# Patient Record
Sex: Female | Born: 1955 | Race: White | Hispanic: No | Marital: Married | Smoking: Former smoker
Health system: Southern US, Community
[De-identification: ages and names within clinical notes are randomized; demographics above are authoritative.]

## PROBLEM LIST (undated history)

## (undated) DIAGNOSIS — K219 Gastro-esophageal reflux disease without esophagitis: Secondary | ICD-10-CM

## (undated) DIAGNOSIS — K635 Polyp of colon: Secondary | ICD-10-CM

## (undated) DIAGNOSIS — G43909 Migraine, unspecified, not intractable, without status migrainosus: Secondary | ICD-10-CM

## (undated) DIAGNOSIS — K5790 Diverticulosis of intestine, part unspecified, without perforation or abscess without bleeding: Secondary | ICD-10-CM

## (undated) DIAGNOSIS — Z8742 Personal history of other diseases of the female genital tract: Secondary | ICD-10-CM

## (undated) DIAGNOSIS — E039 Hypothyroidism, unspecified: Secondary | ICD-10-CM

## (undated) HISTORY — DX: Diverticulosis of intestine, part unspecified, without perforation or abscess without bleeding: K57.90

## (undated) HISTORY — DX: Personal history of other diseases of the female genital tract: Z87.42

## (undated) HISTORY — DX: Gastro-esophageal reflux disease without esophagitis: K21.9

## (undated) HISTORY — DX: Polyp of colon: K63.5

## (undated) HISTORY — DX: Migraine, unspecified, not intractable, without status migrainosus: G43.909

## (undated) HISTORY — DX: Hypothyroidism, unspecified: E03.9

## (undated) HISTORY — PX: OTHER SURGICAL HISTORY: SHX169

---

## 1998-09-23 HISTORY — PX: OTHER SURGICAL HISTORY: SHX169

## 1998-12-04 ENCOUNTER — Other Ambulatory Visit: Admission: RE | Admit: 1998-12-04 | Discharge: 1998-12-04 | Payer: Self-pay | Admitting: Obstetrics and Gynecology

## 2001-07-06 ENCOUNTER — Encounter: Payer: Self-pay | Admitting: Obstetrics and Gynecology

## 2001-07-06 ENCOUNTER — Other Ambulatory Visit: Admission: RE | Admit: 2001-07-06 | Discharge: 2001-07-06 | Payer: Self-pay | Admitting: Obstetrics and Gynecology

## 2001-07-06 ENCOUNTER — Encounter: Admission: RE | Admit: 2001-07-06 | Discharge: 2001-07-06 | Payer: Self-pay | Admitting: Obstetrics and Gynecology

## 2004-06-13 ENCOUNTER — Encounter: Admission: RE | Admit: 2004-06-13 | Discharge: 2004-06-13 | Payer: Self-pay | Admitting: Obstetrics and Gynecology

## 2008-02-01 ENCOUNTER — Encounter: Admission: RE | Admit: 2008-02-01 | Discharge: 2008-02-01 | Payer: Self-pay | Admitting: Obstetrics and Gynecology

## 2010-02-08 ENCOUNTER — Encounter: Admission: RE | Admit: 2010-02-08 | Discharge: 2010-02-08 | Payer: Self-pay | Admitting: Obstetrics and Gynecology

## 2010-10-14 ENCOUNTER — Encounter: Payer: Self-pay | Admitting: General Surgery

## 2011-02-20 ENCOUNTER — Encounter: Payer: Self-pay | Admitting: Gastroenterology

## 2011-02-20 ENCOUNTER — Ambulatory Visit (INDEPENDENT_AMBULATORY_CARE_PROVIDER_SITE_OTHER): Payer: PRIVATE HEALTH INSURANCE | Admitting: Gastroenterology

## 2011-02-20 VITALS — BP 104/70 | HR 72 | Ht 61.0 in | Wt 138.8 lb

## 2011-02-20 DIAGNOSIS — R1310 Dysphagia, unspecified: Secondary | ICD-10-CM

## 2011-02-20 DIAGNOSIS — R933 Abnormal findings on diagnostic imaging of other parts of digestive tract: Secondary | ICD-10-CM

## 2011-02-20 DIAGNOSIS — K625 Hemorrhage of anus and rectum: Secondary | ICD-10-CM

## 2011-02-20 MED ORDER — PEG-KCL-NACL-NASULF-NA ASC-C 100 G PO SOLR: 1.0000 | ORAL | Status: DC

## 2011-02-20 NOTE — Patient Instructions (Addendum)
You will be set up for a colonoscopy for recent rectal bleeding, chronic constipation, colitis on CT. You will be set up for an upper endoscopy for dysphagia We will get records from [facility] ER visit (CT scan, lab results). Please start taking citrucel (orange flavored) powder fiber supplement.  This may cause some bloating at first but that usually goes away. Begin with a small spoonful and work your way up to a large, heaping spoonful daily over a week. A copy of this information will be made available to Tommas Olp, PA.

## 2011-02-20 NOTE — Progress Notes (Signed)
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  She had ice cream last month, dyspepsia, cramping and urge to have a BM.  THen profuse red rectal bleeding.  This went on all night and into the AM.  Cramping and bleeding all night without real stool output, no diarrhea.  Eventually went to PCP office,  She had x-ray and was told she was constipated.  Saw some hemorrhoids on exam.   Was told drink a full bottle of miralax, never had BM output. Went to ED with pain.  Had a CT scan, was told she had "colitis" based on CT.  This was in [facility].  She was given cipro, flagyl and vicodin.  After a week or two she was back to her normal.  Was bothered by hemorrhoids for a while.  Was put on steroid ontments for anal discomfort. Which helped.  She is normallly very constipated.  Usually once a week or twice a week at most.  She has never had a coloscopy.  Also has chronic dysphagic; non-progressive, solid food.   Occurs 1-2 times a week.  For many years.  She has no GERD symptoms.  Overall she has gained weight, 10-20 punds in  A year.    Review of systems: Pertinent positive and negative review of systems were noted in the above HPI section.  All other review of systems was otherwise negative.   Past Medical History, Past Surgical History, Family History, Social History, Current Medications, Allergies were all reviewed with the patient via [facility] electronic medical record system.   Physical Exam: BP 104/70  Pulse 72  Ht 5\' 1"  (1.549 m)  Wt 138 lb 12.8 oz (62.959 kg)  BMI 26.23 kg/m2 Constitutional: generally well-appearing Psychiatric: alert and oriented x3 Eyes: extraocular movements intact Mouth: oral pharynx moist, no lesions Neck: supple no lymphadenopathy Cardiovascular: heart regular rate and rhythm Lungs: clear to auscultation bilaterally Abdomen: soft, nontender, nondistended, no obvious ascites, no peritoneal signs,  normal bowel sounds Extremities: no lower extremity edema bilaterally Skin: no lesions on visible extremities    Assessment and plan: 55 y.o. female with  recent rectal bleeding, change in bowel habits, chronic constipation, chronic intermittent solid food dysphagia  We will get her emergency room records sent over for review, CT scan and lab tests especially. We will proceed with colonoscopy and upper endoscopy at her soonest convenience in the meantime she will start fiber supplements with Citrucel once daily.

## 2011-02-25 ENCOUNTER — Other Ambulatory Visit: Payer: Self-pay | Admitting: Obstetrics

## 2011-02-25 DIAGNOSIS — Z1231 Encounter for screening mammogram for malignant neoplasm of breast: Secondary | ICD-10-CM

## 2011-02-27 ENCOUNTER — Ambulatory Visit (AMBULATORY_SURGERY_CENTER): Payer: PRIVATE HEALTH INSURANCE | Admitting: Gastroenterology

## 2011-02-27 ENCOUNTER — Encounter: Payer: Self-pay | Admitting: Gastroenterology

## 2011-02-27 VITALS — BP 107/73 | HR 64 | Temp 98.5°F | Resp 16 | Ht 61.0 in | Wt 138.0 lb

## 2011-02-27 DIAGNOSIS — K2990 Gastroduodenitis, unspecified, without bleeding: Secondary | ICD-10-CM

## 2011-02-27 DIAGNOSIS — K6289 Other specified diseases of anus and rectum: Secondary | ICD-10-CM

## 2011-02-27 DIAGNOSIS — R198 Other specified symptoms and signs involving the digestive system and abdomen: Secondary | ICD-10-CM

## 2011-02-27 DIAGNOSIS — K2980 Duodenitis without bleeding: Secondary | ICD-10-CM

## 2011-02-27 DIAGNOSIS — R933 Abnormal findings on diagnostic imaging of other parts of digestive tract: Secondary | ICD-10-CM

## 2011-02-27 DIAGNOSIS — K269 Duodenal ulcer, unspecified as acute or chronic, without hemorrhage or perforation: Secondary | ICD-10-CM

## 2011-02-27 DIAGNOSIS — R1310 Dysphagia, unspecified: Secondary | ICD-10-CM

## 2011-02-27 DIAGNOSIS — K2970 Gastritis, unspecified, without bleeding: Secondary | ICD-10-CM

## 2011-02-27 DIAGNOSIS — K5730 Diverticulosis of large intestine without perforation or abscess without bleeding: Secondary | ICD-10-CM

## 2011-02-27 DIAGNOSIS — K2940 Chronic atrophic gastritis without bleeding: Secondary | ICD-10-CM

## 2011-02-27 MED ORDER — ESOMEPRAZOLE MAGNESIUM 40 MG PO CPDR: 40.0000 mg | DELAYED_RELEASE_CAPSULE | Freq: Every day | ORAL | Status: DC

## 2011-02-27 MED ORDER — SODIUM CHLORIDE 0.9 % IV SOLN: 500.0000 mL | INTRAVENOUS | Status: AC

## 2011-02-27 NOTE — Patient Instructions (Signed)
DISCHARGE INSTRUCTIONS REVIEWED WITH PT. AND CAREPARTNER.(SEE BLUE &GREEN SHEETS).INFORMATION ON ESOPHAGEAL STRICTURE WITH RECOMMENDED SOFT DIET FOR TODAY, HIATAL HERNIA, GASTRITIS, DUODENAL ULCER AND DIVERTICULOSIS GIVEN TO & DISCUSSED WITH CAREPARTNER. NEXIUM (ANTI REFLUX MEDICINE) ORDER SENT TO PT'S PHARMACY BY DR JACOBS. THIS MEDICINE TO BE STARTED TODAY OR TOMORROW . THE BEST TIME TO TAKE THIS MEDICINE IS 30 MIN. BEFORE FIRST MEAL OF THE  DAY .YOU WILL RECEIVE A LETTER OR CALL FROM DR. JACOBS WITH RESULTS OF BIOPSIES TAKEN TODAY .

## 2011-02-28 ENCOUNTER — Telehealth: Payer: Self-pay | Admitting: *Deleted

## 2011-02-28 NOTE — Telephone Encounter (Signed)
Follow up Call- Patient questions:  Do you have a fever, pain , or abdominal swelling? yes Pain Score  2 *  Have you tolerated food without any problems? yes  Have you been able to return to your normal activities? yes  Do you have any questions about your discharge instructions: Diet   no Medications  no Follow up visit  no  Do you have questions or concerns about your Care? no  Actions: * If pain score is 4 or above: No action needed, pain <4. Pt c/o left sided abd discomfort, rating as a "2."  Pt told that discomfort might be from some trapped air; told to drink warm fluids throughout day to help bowel relax.  Told to call back as needed

## 2011-03-01 ENCOUNTER — Telehealth: Payer: Self-pay | Admitting: Gastroenterology

## 2011-03-01 NOTE — Telephone Encounter (Signed)
Pt had EGD on Wed and has continued to have swallowing problems.  It has happened with solids and liquids.  Pt wants to know if this is normal and what else should she do?  Please advise.

## 2011-03-01 NOTE — Telephone Encounter (Signed)
Pt aware she will call in a week or so to report on her symptoms

## 2011-03-01 NOTE — Telephone Encounter (Signed)
I dilated a Schatzki's ring.  Perhaps some edema following dilation. Would see how she feels over the next 7-10 days and if still having problems, then she needs repeat EGD and further dilation. Should stay on nexium once daily in meantime

## 2011-03-15 ENCOUNTER — Telehealth: Payer: Self-pay | Admitting: Gastroenterology

## 2011-03-15 NOTE — Telephone Encounter (Signed)
Left message on machine to call back  

## 2011-03-18 NOTE — Telephone Encounter (Signed)
Pt is saying that the dysphagia is not as severe but is more frequent. Has problems with solids and liquids.  She was given her biopsy results.  Wants to know what else to do?

## 2011-03-18 NOTE — Telephone Encounter (Signed)
Pt is possibly available for this Thursday she will check with her job and call back

## 2011-03-18 NOTE — Telephone Encounter (Signed)
Should have repeat EGD at [facility] with balloon dilation

## 2011-03-18 NOTE — Telephone Encounter (Signed)
Pt has been scheduled for EGD with Ballon dil for 03/21/11 she is aware and has been instructed.

## 2011-03-20 ENCOUNTER — Telehealth: Payer: Self-pay

## 2011-03-20 NOTE — Telephone Encounter (Signed)
I tried to reach pt to reschedule appt tomorrow to 730 from 1130 because of a cx. I was unable to reach the pt at home or work.

## 2011-03-21 ENCOUNTER — Ambulatory Visit (HOSPITAL_COMMUNITY)
Admission: RE | Admit: 2011-03-21 | Discharge: 2011-03-21 | Disposition: A | Discharge status: Home / Self Care | Payer: PRIVATE HEALTH INSURANCE | Source: Ambulatory Visit | Attending: Gastroenterology | Admitting: Gastroenterology

## 2011-03-21 ENCOUNTER — Encounter: Payer: PRIVATE HEALTH INSURANCE | Admitting: Gastroenterology

## 2011-03-21 DIAGNOSIS — K222 Esophageal obstruction: Secondary | ICD-10-CM | POA: Insufficient documentation

## 2011-03-21 DIAGNOSIS — R1310 Dysphagia, unspecified: Secondary | ICD-10-CM

## 2011-04-02 ENCOUNTER — Ambulatory Visit
Admission: RE | Admit: 2011-04-02 | Discharge: 2011-04-02 | Disposition: A | Discharge status: Home / Self Care | Payer: PRIVATE HEALTH INSURANCE | Source: Ambulatory Visit | Attending: Obstetrics | Admitting: Obstetrics

## 2011-04-02 DIAGNOSIS — Z1231 Encounter for screening mammogram for malignant neoplasm of breast: Secondary | ICD-10-CM

## 2011-04-18 ENCOUNTER — Encounter: Payer: Self-pay | Admitting: Podiatry

## 2011-06-21 ENCOUNTER — Other Ambulatory Visit: Payer: Self-pay | Admitting: Obstetrics & Gynecology

## 2012-05-22 ENCOUNTER — Ambulatory Visit: Payer: Self-pay | Admitting: Emergency Medicine

## 2012-05-22 VITALS — BP 105/63 | HR 99 | Temp 98.3°F | Resp 20 | Ht 62.0 in | Wt 119.0 lb

## 2012-05-22 DIAGNOSIS — L270 Generalized skin eruption due to drugs and medicaments taken internally: Secondary | ICD-10-CM

## 2012-05-22 DIAGNOSIS — L259 Unspecified contact dermatitis, unspecified cause: Secondary | ICD-10-CM

## 2012-05-22 DIAGNOSIS — W57XXXA Bitten or stung by nonvenomous insect and other nonvenomous arthropods, initial encounter: Secondary | ICD-10-CM

## 2012-05-22 MED ORDER — DOXYCYCLINE HYCLATE 100 MG PO TABS
100.0000 mg | ORAL_TABLET | Freq: Two times a day (BID) | ORAL | Status: AC
Start: 2012-05-22 — End: 2012-06-01

## 2012-05-22 NOTE — Patient Instructions (Addendum)
Deer Tick Bite Deer ticks are brown arachnids (spider family) that vary in size from as small as the head of a pin to 1/4 inch (1/2 cm) diameter. They thrive in wooded areas. Deer are the preferred host of adult deer ticks. Small rodents are the host of young ticks (nymphs). When a person walks in a field or wooded area, young and adult ticks in the surrounding grass and vegetation can attach themselves to the skin. They can suck blood for hours to days if unnoticed. Ticks are found all over the [place] Some ticks carry a specific bacteria (Borrelia burgdorferi) that causes an infection called Lyme disease. The bacteria is typically passed into a person during the blood sucking process. This happens after the tick has been attached for at least a number of hours. While ticks can be found all over the [place] those carrying the bacteria that causes Lyme disease are most common in [place]. Only a small proportion of ticks in these areas carry the Lyme disease bacteria and cause human infections. Ticks usually attach to warm spots on the body, such as the:  Head.   Back.   Neck.   Armpits.   Groin.  SYMPTOMS  Most of the time, a deer tick bite will not be felt. You may or may not see the attached tick. You may notice mild irritation or redness around the bite site. If the deer tick passes the Lyme disease bacteria to a person, a round, red rash may be noticed 2 to 3 days after the bite. The rash may be clear in the middle, like a bull's-eye or target. If not treated, other symptoms may develop several days to weeks after the onset of the rash. These symptoms may include:  New rash lesions.   Fatigue and weakness.   General ill feeling and achiness.   Chills.   Headache and neck pain.   Swollen lymph glands.   Sore muscles and joints.  5 to 15% of untreated people with Lyme disease may develop more severe illnesses after several weeks to months. This may include inflammation  of the brain lining (meningitis), nerve palsies, an abnormal heartbeat, or severe muscle and joint pain and inflammation (myositis or arthritis). DIAGNOSIS   Physical exam and medical history.   Viewing the tick if it was saved for confirmation.   Blood tests (to check or confirm the presence of Lyme disease).  TREATMENT  Most ticks do not carry disease. If found, an attached tick should be removed using tweezers. Tweezers should be placed under the body of the tick so it is removed by its attachment parts (pincers). If there are signs or symptoms of being sick, or Lyme disease is confirmed, medicines (antibiotics) that kill germs are usually prescribed. In more severe cases, antibiotics may be given through an intravenous (IV) access. HOME CARE INSTRUCTIONS   Always remove ticks with tweezers. Do not use petroleum jelly or other methods to kill or remove the tick. Slide the tweezers under the body and pull out as much as you can. If you are not sure what it is, save it in a jar and show your caregiver.   Once you remove the tick, the skin will heal on its own. Wash your hands and the affected area with water and soap. You may place a bandage on the affected area.   Take medicine as directed. You may be advised to take a full course of antibiotics.   Follow up  be advised to take a full course of antibiotics.   Follow up with your caregiver as recommended.  FINDING OUT THE RESULTS OF YOUR TEST  Not all test results are available during your visit. If your test results are not back during the visit, make an appointment with your caregiver to find out the results. Do not assume everything is normal if you have not heard from your caregiver or the medical facility. It is important for you to follow up on all of your test results.  PROGNOSIS   If Lyme disease is confirmed, early treatment with antibiotics is very effective. Following preventive guidelines is important since it is possible to get the disease more than once.  PREVENTION    Wear long sleeves and long pants in  wooded or grassy areas. Tuck your pants into your socks.   Use an insect repellent while hiking.   Check yourself, your children, and your pets regularly for ticks after playing outside.   Clear piles of leaves or brush from your yard. Ticks might live there.  SEEK MEDICAL CARE IF:    You or your child has an oral temperature above 102 F (38.9 C).   You develop a severe headache following the bite.   You feel generally ill.   You notice a rash.   You are having trouble removing the tick.   The bite area has red skin or yellow drainage.  SEEK IMMEDIATE MEDICAL CARE IF:    Your face is weak and droopy or you have other neurological symptoms.   You have severe joint pain or weakness.  MAKE SURE YOU:    Understand these instructions.   Will watch your condition.   Will get help right away if you are not doing well or get worse.  FOR MORE INFORMATION  Centers for Disease Control and Prevention: www.cdc.gov  American Academy of Family Physicians: www.aafp.org  Document Released: 12/04/2009 Document Revised: 08/29/2011 Document Reviewed: 12/04/2009  ExitCare Patient Information 2012 [employer].

## 2012-05-22 NOTE — Progress Notes (Signed)
  Subjective:    Patient ID: Ruth Townsend, female    DOB: [birth year], 56 y.o.   MRN: [id number]  HPI Patient presents today with bites on her legs,back. She also had a tick on the left side of her abdomin and on the back of her leg. She has a small rash under her breast and she also has another spot that started on her lip today. She definitely present ticks off of her body in the last week. Apparently she has a lot of difficulty with getting recurrent insect bites. She has lot trouble with itching intends to scratch them      Review of Systems     Objective:   Physical Exam there is a red circular area about 2 x 3 cm underneath the right breast. There are multiple bite-like areas on her upper back. She has bites on both legs some of them behind the left knee which are excoriated and bruising around the knee there is a bite area left lower abdomen with an area of redness around the bite site which measures approximately 5 x 8".        Assessment & Plan:  Patient seen with multiple bites. 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 I will cover her with 2 weeks of doxycycline. We decided not to do blood work for financial reasons a culture was taken of the area beneath her right breast to rule out impetigo.

## 2012-05-26 LAB — WOUND CULTURE
Gram Stain: NONE SEEN
Gram Stain: NONE SEEN

## 2012-05-29 ENCOUNTER — Telehealth: Payer: Self-pay

## 2012-05-29 NOTE — Telephone Encounter (Signed)
Pt states that the redness that she had while seen in our clinic recently has now doubled and has spread to her other breast. The doxycycline that was prescribe does not appear to be working for her, it was improving but as of yesterday the redness began getting worse. Best# [phone]

## 2012-05-29 NOTE — Telephone Encounter (Signed)
Pt.notified

## 2012-05-29 NOTE — Telephone Encounter (Signed)
Patient should be improving, our lab note indicated she was better, she has a MRSA infection. She needs to return to clinic for re evaluation of this area, since it has gotten worse.

## 2012-05-29 NOTE — Telephone Encounter (Signed)
If cellulitis is worsening like patient has stated this must be re-evaluated.

## 2012-05-29 NOTE — Telephone Encounter (Signed)
I have called patient to advise (have consulted with Herbert Seta and patient should return to clinic since no better) patient states she has stopped the Doxycycline since it was not helping.  I have advised her she needs to complete the antibiotics and not to stop this. She wants a Rx for Septra sent in. I advised her we need to see her since this area is worsening, she is in [place] and would not be able to come in until tomorrow. Please advise if we can send in ABX to [facility] or if she needs to be seen. I have already advised her she needs to be seen, but she wants me to ask again.

## 2012-05-30 ENCOUNTER — Ambulatory Visit: Payer: Self-pay | Admitting: Emergency Medicine

## 2012-05-30 VITALS — BP 100/67 | HR 94 | Temp 98.0°F | Resp 16 | Ht 61.5 in | Wt 120.0 lb

## 2012-05-30 DIAGNOSIS — B372 Candidiasis of skin and nail: Secondary | ICD-10-CM

## 2012-05-30 MED ORDER — NYSTATIN 100000 UNIT/GM EX POWD: 1.0000 | Freq: Two times a day (BID) | CUTANEOUS | Status: DC

## 2012-05-30 MED ORDER — FLUCONAZOLE 100 MG PO TABS: 100.0000 mg | ORAL_TABLET | Freq: Every day | ORAL | Status: AC

## 2012-05-30 NOTE — Progress Notes (Signed)
   Date:  05/30/2012   Name:  Ruth Townsend   DOB:  1956-04-09   MRN:  [id number] Gender: female Age: 56 y.o.  PCP:  Provider Not In System    Chief Complaint: Rash   History of Present Illness:  Ruth Townsend is a 56 y.o. pleasant patient who presents with the following:  Taking doxy for MRSA.  Now has rash under breasts.  Marginally compliant with antibiotic course.  No fever or chills.  There is no problem list on file for this patient.   No past medical history on file.  Past Surgical History  Procedure Date  . Broken leg 2000  . Heart ablation     Childhood    History  Substance Use Topics  . Smoking status: Former Smoker    Quit date: 05/22/2005  . Smokeless tobacco: Never Used  . Alcohol Use: No    No family history on file.  No Known Allergies  Medication list has been reviewed and updated.  Current Outpatient Prescriptions on File Prior to Visit  Medication Sig Dispense Refill  . doxycycline (VIBRA-TABS) 100 MG tablet Take 1 tablet (100 mg total) by mouth 2 (two) times daily.  28 tablet  0  . Ondansetron HCl (ZOFRAN PO) Take 1 tablet by mouth as needed.        . SUMAtriptan (IMITREX) 100 MG tablet Take 100 mg by mouth every 2 (two) hours as needed.        Marland Kitchen esomeprazole (NEXIUM) 40 MG capsule Take 1 capsule (40 mg total) by mouth daily.  30 capsule  4  . Levothyroxine Sodium (SYNTHROID PO) Take 1 tablet by mouth daily.         Current Facility-Administered Medications on File Prior to Visit  Medication Dose Route Frequency Provider Last Rate Last Dose  . 0.9 %  sodium chloride infusion  500 mL Intravenous Continuous Rachael Fee, MD        Review of Systems:  As per HPI, otherwise negative.    Physical Examination: Filed Vitals:   05/30/12 0912  BP: 100/67  Pulse: 94  Temp: 98 F (36.7 C)  Resp: 16   Filed Vitals:   05/30/12 0912  Height: 5' 1.5" (1.562 m)  Weight: 120 lb (54.432 kg)   Body mass index is 22.31  kg/(m^2). Ideal Body Weight: Weight in (lb) to have BMI = 25: 134.2    GEN: WDWN, NAD, Non-toxic, Alert & Oriented x 3 HEENT: Atraumatic, Normocephalic.  Ears and Nose: No external deformity. EXTR: No clubbing/cyanosis/edema NEURO: Normal gait.  PSYCH: Normally interactive. Conversant. Not depressed or anxious appearing.  Calm demeanor.  SKIN:  Candidal intertrigo  Assessment and Plan: Candidal intertrigo Diflucan Continue doxy Follow up as needed  Carmelina Dane, MD

## 2012-06-10 ENCOUNTER — Telehealth: Payer: Self-pay

## 2012-06-10 NOTE — Telephone Encounter (Signed)
The patient called to request that a clinical team member return her call to discuss some medical issues she is experiencing.  Please call the patient at [phone].

## 2012-06-11 NOTE — Telephone Encounter (Signed)
LMOM to CB. 

## 2012-12-03 ENCOUNTER — Other Ambulatory Visit: Payer: Self-pay

## 2012-12-03 DIAGNOSIS — Z1231 Encounter for screening mammogram for malignant neoplasm of breast: Secondary | ICD-10-CM

## 2013-01-12 ENCOUNTER — Ambulatory Visit: Payer: PRIVATE HEALTH INSURANCE

## 2013-01-19 ENCOUNTER — Ambulatory Visit
Admission: RE | Admit: 2013-01-19 | Discharge: 2013-01-19 | Disposition: A | Discharge status: Home / Self Care | Payer: No Typology Code available for payment source | Source: Ambulatory Visit

## 2013-01-19 DIAGNOSIS — Z1231 Encounter for screening mammogram for malignant neoplasm of breast: Secondary | ICD-10-CM

## 2013-05-06 ENCOUNTER — Other Ambulatory Visit (INDEPENDENT_AMBULATORY_CARE_PROVIDER_SITE_OTHER): Payer: No Typology Code available for payment source

## 2013-05-06 ENCOUNTER — Encounter: Payer: Self-pay | Admitting: Physician Assistant

## 2013-05-06 ENCOUNTER — Ambulatory Visit (INDEPENDENT_AMBULATORY_CARE_PROVIDER_SITE_OTHER): Payer: No Typology Code available for payment source | Admitting: Physician Assistant

## 2013-05-06 ENCOUNTER — Other Ambulatory Visit: Payer: Self-pay | Admitting: *Deleted

## 2013-05-06 ENCOUNTER — Telehealth: Payer: Self-pay | Admitting: Gastroenterology

## 2013-05-06 ENCOUNTER — Telehealth: Payer: Self-pay | Admitting: *Deleted

## 2013-05-06 VITALS — BP 90/54 | HR 85 | Temp 99.2°F | Ht 61.5 in | Wt 137.0 lb

## 2013-05-06 DIAGNOSIS — R1032 Left lower quadrant pain: Secondary | ICD-10-CM

## 2013-05-06 DIAGNOSIS — E039 Hypothyroidism, unspecified: Secondary | ICD-10-CM

## 2013-05-06 DIAGNOSIS — K5730 Diverticulosis of large intestine without perforation or abscess without bleeding: Secondary | ICD-10-CM

## 2013-05-06 DIAGNOSIS — Z8719 Personal history of other diseases of the digestive system: Secondary | ICD-10-CM

## 2013-05-06 DIAGNOSIS — R509 Fever, unspecified: Secondary | ICD-10-CM

## 2013-05-06 DIAGNOSIS — Z8669 Personal history of other diseases of the nervous system and sense organs: Secondary | ICD-10-CM

## 2013-05-06 DIAGNOSIS — R110 Nausea: Secondary | ICD-10-CM

## 2013-05-06 LAB — BASIC METABOLIC PANEL
BUN: 12 mg/dL (ref 6–23)
Chloride: 103 mEq/L (ref 96–112)
GFR: 60.67 mL/min (ref >60.00)
Potassium: 4.4 mEq/L (ref 3.5–5.1)
Sodium: 137 mEq/L (ref 135–145)

## 2013-05-06 MED ORDER — METRONIDAZOLE 500 MG PO TABS: 500.0000 mg | ORAL_TABLET | Freq: Two times a day (BID) | ORAL | Status: AC

## 2013-05-06 MED ORDER — TRAMADOL HCL 50 MG PO TABS: 50.0000 mg | ORAL_TABLET | Freq: Four times a day (QID) | ORAL | Status: DC | PRN

## 2013-05-06 MED ORDER — CIPROFLOXACIN HCL 500 MG PO TABS: 500.0000 mg | ORAL_TABLET | Freq: Two times a day (BID) | ORAL | Status: AC

## 2013-05-06 NOTE — Telephone Encounter (Signed)
Pt has been having severe lower abd pain, unable to walk.  GYN says she may have a partial obstruction.  No bowel movement since Tue,  She is passing gas.  Fever of 100.4.  Pain today is on the left side and very tender.  Pain is a 10 at the worst, and a 2 today if she does not move.  Pt does not want to go to the ER.  Pt added to [facility] today 1030 am

## 2013-05-06 NOTE — Patient Instructions (Addendum)
Go to our lab, basement level before leaving today. Go to [facility], [address] to pick up the contrast. Follow their instructions. Arrive for the CT scan at 1:45 PM.  We will call you with the results.

## 2013-05-06 NOTE — Telephone Encounter (Signed)
Called the patient to advise her of her CT scan results which showed Sigmoid Diverticulitis.  We sent prescriptions to [facility], [address] [place], [place].  Cipro 500 mg and Flagyl 500 mg, take 1 tab twice daily for 14 days. She complained of eating food and getting bloated.  Amy advised me to tell her to get some Align Probiotic and take 1 cap daily while she is taking the antibiotics.  Also advised her for some foods that produce gas she can take Gas-X or Phazyme 1-2 before a meal.

## 2013-05-06 NOTE — Progress Notes (Signed)
Subjective:    Patient ID: Ruth Townsend, female    DOB: Jul 28, 1956, 57 y.o.   MRN: [id number]  HPI  Ruth Townsend is a pleasant 57 year old white female known to Dr. Wendall Papa from previous procedures. She had undergone colonoscopy in June of 2012 for screening and was found to have mild diverticulosis in the left colon. She had EGD at that same time with finding of gastritis and a clean base duodenal ulcer other medical problems include hypothyroidism and migraine headaches. She comes in today as an acute work in with complaints of acute left lower quadrant abdominal pain which has been intense. She says she had mild lower abdominal pain about a month ago that bothered her off and on for about a week was associated with some chills and low-grade fever and then resolved. She said she had acute onset of lower abdominal  pain on Monday, 05/03/2013 and says the pain has been fairly constant since then. She says initially was located across her lower abdomen and then settled into the left lower quadrant and she has felt miserable. She says pain is worse with any movement and walking bending etc. She's had a difficult time sleeping due to pain. She documented her temp at 100.4 at home and has had chills. She's had some nausea but no vomiting her appetite has been decreased though no definite increase in pain with by mouth intake. She's not having any dysuria urgency or frequency. Has no history of kidney stones. She was seen by her gynecologist yesterday who felt her pain was GI in origin and did not do any pelvic ultrasound etc. Patient did a CBC and a UA on herself and she works at a pediatrician's office. The UA apparently was positive for microscopic blood and CBC showed a white count of 10.2 hemoglobin 14.2 hematocrit of 44 the    Review of Systems  Constitutional: Positive for fever, chills and activity change.  HENT: Negative.   Eyes: Negative.   Respiratory: Negative.   Cardiovascular: Negative.    Gastrointestinal: Positive for nausea and abdominal pain.  Endocrine: Negative.   Musculoskeletal: Negative.   Skin: Negative.   Allergic/Immunologic: Negative.   Neurological: Negative.   Hematological: Negative.    Outpatient Prescriptions Prior to Visit  Medication Sig Dispense Refill  . Levothyroxine Sodium (SYNTHROID PO) Take 1 tablet by mouth daily.        . SUMAtriptan (IMITREX) 100 MG tablet Take 100 mg by mouth every 2 (two) hours as needed.        Marland Kitchen esomeprazole (NEXIUM) 40 MG capsule Take 1 capsule (40 mg total) by mouth daily.  30 capsule  4  . nystatin (MYCOSTATIN/NYSTOP) 100000 UNIT/GM POWD Apply 1 Bottle topically 2 (two) times daily.  60 g  1  . Ondansetron HCl (ZOFRAN PO) Take 1 tablet by mouth as needed.         Facility-Administered Medications Prior to Visit  Medication Dose Route Frequency Provider Last Rate Last Dose  . 0.9 %  sodium chloride infusion  500 mL Intravenous Continuous Rachael Fee, MD          No Known Allergies Patient Active Problem List   Diagnosis Date Noted  . Diverticulosis of colon without hemorrhage 05/06/2013  . H/O: duodenal ulcer 05/06/2013  . History of migraine headaches 05/06/2013  . Unspecified hypothyroidism 05/06/2013   History  Substance Use Topics  . Smoking status: Former Smoker    Quit date: 05/22/2005  . Smokeless tobacco: Never Used  .  Alcohol Use: No   family history includes Glaucoma in her mother; Heart attack in her paternal grandfather; Macular degeneration in her father.  Objective:   Physical Exam  Well-developed white female in no acute distress, pleasant blood pressure 90/54 pulse 85 temp 99 2 height 5 foot 1 weight 137. HEENT; nontraumatic normocephalic EOMI PERRLA sclera anicteric, Supple; no JVD, Cardiovascular; regular rate and rhythm with S1-S2 no murmur or gallop, Pulmonary; clear bilaterally, Abdomen ;soft she is quite tender in the left lower quadrant and suprapubic area there is some guarding no  rebound no palpable mass or hepatosplenomegaly bowel sounds are present, Rectal; exam not done, Extremities; no clubbing cyanosis or edema skin warm and dry, Psych; mood and affect normal and appropriate.       Assessment & Plan:  #33 57 year old white female with 3 day history of acute constant left lower quadrant abdominal pain associated with fever and chills. I suspect she has acute diverticulitis, this would be her first episode. Other possibilities include a ruptured ovarian cyst, ureterolithiasis or other acute lower abdominal inflammatory process .  Plan; schedule for CT scan of the abdomen and pelvis today with contrast Ultram 50 mg one every 4-6 hours as needed for pain #30 no refills Will hold on antibiotics until CT results available

## 2013-05-09 NOTE — Progress Notes (Signed)
i agree with the plan outlined here 

## 2013-05-13 ENCOUNTER — Encounter: Payer: Self-pay | Admitting: Gastroenterology

## 2013-07-15 ENCOUNTER — Telehealth: Payer: Self-pay | Admitting: Physician Assistant

## 2013-07-16 NOTE — Telephone Encounter (Signed)
Left a message for patient to call me. 

## 2013-07-19 NOTE — Telephone Encounter (Signed)
Patient asking if CT showed a hernia. Told her the report did not show this. She asked if she should clean out her bowels several times a year like when you get a colonoscopy. Told patient we do not recommend "cleaning out you bowels" with purges on a routine basis.

## 2014-04-08 ENCOUNTER — Other Ambulatory Visit: Payer: Self-pay

## 2014-04-08 DIAGNOSIS — Z1231 Encounter for screening mammogram for malignant neoplasm of breast: Secondary | ICD-10-CM

## 2014-04-15 ENCOUNTER — Ambulatory Visit: Payer: No Typology Code available for payment source

## 2014-04-26 ENCOUNTER — Ambulatory Visit: Payer: No Typology Code available for payment source

## 2014-05-05 ENCOUNTER — Ambulatory Visit: Payer: No Typology Code available for payment source

## 2014-05-25 ENCOUNTER — Encounter: Payer: Self-pay | Admitting: Obstetrics and Gynecology

## 2014-06-08 ENCOUNTER — Ambulatory Visit
Admission: RE | Admit: 2014-06-08 | Discharge: 2014-06-08 | Disposition: A | Discharge status: Home / Self Care | Payer: No Typology Code available for payment source | Source: Ambulatory Visit

## 2014-06-08 DIAGNOSIS — Z1231 Encounter for screening mammogram for malignant neoplasm of breast: Secondary | ICD-10-CM

## 2014-06-10 ENCOUNTER — Ambulatory Visit: Payer: No Typology Code available for payment source

## 2014-06-30 ENCOUNTER — Telehealth: Payer: Self-pay | Admitting: Obstetrics and Gynecology

## 2014-06-30 NOTE — Telephone Encounter (Signed)
error 

## 2014-07-07 ENCOUNTER — Telehealth: Payer: Self-pay | Admitting: Gastroenterology

## 2014-07-07 NOTE — Telephone Encounter (Signed)
The records are on your desk for review.  

## 2014-07-07 NOTE — Telephone Encounter (Signed)
Over the weekend the pt felt constipated and went to the health food store and bought "magnesium pills" she took 5 that night and had a "massive" bowel movement.  She states she woke up on Tuesday with lower abd pain and fever, went to the ER told she was dehydrated, given fluids and told to f/u with PCP.  She continues to feel sore in her abd and is worried about an obstruction.  She has not had a bowel movement since Sat but is passing gas.  She also has a history of diverticulitis but she says the pain is different and only across the lower abd.  I offered extender appt but she declines to schedule and wants to see how she feels later today.  She will have her records [facility] sent for review.  I did advise the pt that she may not have had a bowel movement because she cleaned herself out on Saturday and she is on liquids only.  She was told to call back if any symptoms worsen or go to the ER.  I will forward to Dr Christella HartiganJacobs for review.

## 2014-07-07 NOTE — Telephone Encounter (Signed)
I agree with the plan.  Unlikely to have obstruction since she's having flatus.  Has had diverticulitis in the past.  Will review [facility] records.

## 2014-07-08 ENCOUNTER — Encounter: Payer: Self-pay | Admitting: Obstetrics and Gynecology

## 2014-07-08 ENCOUNTER — Ambulatory Visit (INDEPENDENT_AMBULATORY_CARE_PROVIDER_SITE_OTHER): Payer: No Typology Code available for payment source | Admitting: Obstetrics and Gynecology

## 2014-07-08 VITALS — BP 108/60 | HR 80 | Resp 16 | Ht 61.0 in | Wt 134.0 lb

## 2014-07-08 DIAGNOSIS — R312 Other microscopic hematuria: Secondary | ICD-10-CM

## 2014-07-08 DIAGNOSIS — Z0000 Encounter for general adult medical examination without abnormal findings: Secondary | ICD-10-CM

## 2014-07-08 DIAGNOSIS — Z01419 Encounter for gynecological examination (general) (routine) without abnormal findings: Secondary | ICD-10-CM

## 2014-07-08 DIAGNOSIS — L819 Disorder of pigmentation, unspecified: Secondary | ICD-10-CM

## 2014-07-08 DIAGNOSIS — R3129 Other microscopic hematuria: Secondary | ICD-10-CM

## 2014-07-08 LAB — POCT URINALYSIS DIPSTICK
Bilirubin, UA: NEGATIVE
Glucose, UA: NEGATIVE
Ketones, UA: NEGATIVE
Leukocytes, UA: NEGATIVE
NITRITE UA: NEGATIVE
PH UA: 6
Protein, UA: NEGATIVE
UROBILINOGEN UA: NEGATIVE

## 2014-07-08 NOTE — Patient Instructions (Signed)

## 2014-07-08 NOTE — Telephone Encounter (Signed)
I reviewed [facility] ER MD notes, lab results.  It doesn't look like she had obstruction or infection (WBC was normal).

## 2014-07-08 NOTE — Progress Notes (Signed)
58 y.o. [id number] MarriedCaucasianF here for annual exam.   Patient is on HRT and would like to have yearly paps.  All paps have been normal.   Has struggled with weight gain in menopause.  Patient is doing bioidentical HRT and [facility] [place].  [facility]. Testosterone and estrogen are implants in buttock. Taking oral progesterone.  No dyspareunia.  Feels great.   Has lost 14 pounds so far in her hormones and weight loss medication.  Recently took several days of magnesium for weight loss and had significant diarrhea.  Abdomen now just plain sore. Had some body aches and shakiness also. Went to the ER this week for evaluation due to her symptoms.  Will have follow up with gastroenterology who has asked for these records from the ER visit.  History of diverticulitis and ulcer but the abdominal pain is not the same.   LMP - 2004.  No LMP recorded. Patient is postmenopausal.          Sexually active: Yes.    The current method of family planning is post menopausal status.    Exercising: No.  The patient does not participate in regular exercise at present. Smoker:  no  Health Maintenance: Pap:  unsure History of abnormal Pap:  unsure MMG:  06/08/14 BIRADS1:Neg Colonoscopy:  02/2011 repeat 10 years  BMD:   None TDaP:  Up to date Screening Labs: hospital, Hb today: hospital, Urine today: AVW:UJWJXRBC:Large, PH:6.0 - Asymptomatic.    reports that she quit smoking about 9 years ago. She has never used smokeless tobacco. She reports that she does not drink alcohol or use illicit drugs.  Past Medical History  Diagnosis Date  . Hypothyroidism   . Migraines   . GERD (gastroesophageal reflux disease)   . Diverticulosis   . Colon polyp     Past Surgical History  Procedure Laterality Date  . Broken leg  2000  . Heart ablation      Childhood    Current Outpatient Prescriptions  Medication Sig Dispense Refill  . ARMOUR THYROID 30  MG tablet Take 1 tablet by mouth daily.      . progesterone (PROMETRIUM) 100 MG capsule Take 100 mg by mouth.      . SUMAtriptan (IMITREX) 100 MG tablet Take 100 mg by mouth every 2 (two) hours as needed.        Marland Kitchen. VITAMIN D, CHOLECALCIFEROL, PO Take by mouth daily.      . Vitamin D, Ergocalciferol, (DRISDOL) 50000 UNITS CAPS capsule Take 50,000 Units by mouth.       Current Facility-Administered Medications  Medication Dose Route Frequency Provider Last Rate Last Dose  . 0.9 %  sodium chloride infusion  500 mL Intravenous Continuous Rachael Feeaniel P Jacobs, MD        Family History  Problem Relation Age of Onset  . Macular degeneration Father   . Glaucoma Mother   . Heart attack Paternal Grandfather     ROS:  Pertinent items are noted in HPI.  Otherwise, a comprehensive ROS was negative.  Exam:   BP 108/60  Pulse 80  Resp 16  Ht 5\' 1"  (1.549 m)  Wt 134 lb (60.782 kg)  BMI 25.33 kg/m2     Height: 5\' 1"  (154.9 cm)  Ht Readings from Last 3 Encounters:  07/08/14 5\' 1"  (1.549 m)  05/06/13 5' 1.5" (1.562 m)  05/30/12 5' 1.5" (1.562 m)    General appearance: alert, cooperative and  appears stated age Head: Normocephalic, without obvious abnormality, atraumatic Neck: no adenopathy, supple, symmetrical, trachea midline and thyroid normal to inspection and palpation Lungs: clear to auscultation bilaterally Breasts: normal appearance, no masses or tenderness, Inspection negative, No nipple retraction or dimpling, No nipple discharge or bleeding Heart: regular rate and rhythm Abdomen: soft, non-tender; bowel sounds normal; no masses,  no organomegaly Extremities: extremities normal, atraumatic, no cyanosis or edema Skin: Skin color, texture, turgor normal. No rashes or lesions.  Multiple areas of hyperpigmented areas of legs.  Lymph nodes: Cervical, supraclavicular, and axillary nodes normal. No abnormal inguinal nodes palpated Neurologic: Grossly normal   Pelvic: External genitalia:  no  lesions              Urethra:  normal appearing urethra with no masses, tenderness or lesions              Bartholins and Skenes: normal                 Vagina: normal appearing vagina with normal color and discharge, no lesions              Cervix: anteverted              Pap taken: Yes.   Bimanual Exam:  Uterus:  normal size, contour, position, consistency, mobility, non-tender              Adnexa: no mass, fullness, tenderness               Rectovaginal: Confirms               Anus:  normal sphincter tone, no lesions  A:  Well Woman with normal exam HRT patient.  On bioidentcial hormones through outside clinic. Recent dehydration and diarrhea from magnesium supplements.  Microscopic hematuria.  Hyperpigmentation of legs.  Age spots?  P:   Mammogram yearly.  pap smear and HR HPV testing. Discussed HRT.  She will continue with bioidentical HRT. Urine micro and culture.  I recommended a normal balanced diet and probiotics for normal bowel function.  Discussed slow gradual weight loss with exercise as a way to have long lasting weight loss. Referral to Dr. Campbell StallHope Gruber.  return annually or prn  An After Visit Summary was printed and given to the patient.  30 additional minutes face to face time discussing abdominal pain, diarrhea and hydration, weight gain in menopause, and hormonal therapy.  This really was a problem visit in addition to a usual annual exam.

## 2014-07-09 LAB — URINALYSIS, MICROSCOPIC ONLY
BACTERIA UA: NONE SEEN
Casts: NONE SEEN
Crystals: NONE SEEN
Squamous Epithelial / LPF: NONE SEEN

## 2014-07-10 LAB — URINE CULTURE

## 2014-07-11 NOTE — Telephone Encounter (Signed)
Mailbox is full unable to reach pt

## 2014-07-12 NOTE — Telephone Encounter (Signed)
Mailbox is still full, I will wait for further communication from the pt

## 2014-07-13 LAB — IPS PAP TEST WITH HPV

## 2014-07-15 ENCOUNTER — Telehealth: Payer: Self-pay

## 2014-07-15 NOTE — Telephone Encounter (Signed)
Message copied by Alphonsa OverallIXON, AMANDA L on Fri Jul 15, 2014  1:23 PM ------      Message from: AMUNDSON DE Gwenevere GhaziARVALHO E SILVA, BROOK E      Created: Wed Jul 13, 2014  9:51 PM       Please report ASCUS pap and negative HR HPV status to patient.       This is considered a negative pap by current protocol in pap smear triage.       What is good is that the HR HPV status is negative, so there is no worry about cancer of the cervix.       She will have her next pap in 3 years. ------

## 2014-07-15 NOTE — Telephone Encounter (Signed)
Message copied by Alphonsa OverallIXON, Nga Rabon L on Fri Jul 15, 2014  1:25 PM ------      Message from: AMUNDSON DE Gwenevere GhaziARVALHO E SILVA, BROOK E      Created: Wed Jul 13, 2014  9:51 PM       Please report ASCUS pap and negative HR HPV status to patient.       This is considered a negative pap by current protocol in pap smear triage.       What is good is that the HR HPV status is negative, so there is no worry about cancer of the cervix.       She will have her next pap in 3 years. ------

## 2014-07-15 NOTE — Telephone Encounter (Signed)
Called patient at [phone]#[phone], left message for her to call me back.

## 2014-07-19 ENCOUNTER — Telehealth: Payer: Self-pay | Admitting: Obstetrics and Gynecology

## 2014-07-19 NOTE — Telephone Encounter (Signed)
Call to patient. Advised that she is scheduled to see Dr Danella DeisGruber 11.02.2015. Patient states that this will not work for her and that she will call to schedule. Provided patient with the telephone number for the office.

## 2014-07-21 ENCOUNTER — Telehealth: Payer: Self-pay

## 2014-07-21 NOTE — Telephone Encounter (Signed)
Message copied by Alphonsa OverallIXON, AMANDA L on Thu Jul 21, 2014  9:15 AM ------      Message from: AMUNDSON DE Gwenevere GhaziARVALHO E SILVA, BROOK E      Created: Wed Jul 13, 2014  9:51 PM       Please report ASCUS pap and negative HR HPV status to patient.       This is considered a negative pap by current protocol in pap smear triage.       What is good is that the HR HPV status is negative, so there is no worry about cancer of the cervix.       She will have her next pap in 3 years. ------

## 2014-07-21 NOTE — Telephone Encounter (Signed)
Message copied by Alphonsa OverallIXON, Pranika Finks L on Thu Jul 21, 2014  9:18 AM ------      Message from: AMUNDSON DE Gwenevere GhaziARVALHO E SILVA, BROOK E      Created: Wed Jul 13, 2014  9:51 PM       Please report ASCUS pap and negative HR HPV status to patient.       This is considered a negative pap by current protocol in pap smear triage.       What is good is that the HR HPV status is negative, so there is no worry about cancer of the cervix.       She will have her next pap in 3 years. ------

## 2014-07-21 NOTE — Telephone Encounter (Signed)
Called patient to discuss pap results at [phone], left message for her to call me.

## 2014-07-21 NOTE — Telephone Encounter (Signed)
Called patient to discuss pap smear results at [phone] unable to leave message because "mailbox full".

## 2014-07-22 ENCOUNTER — Telehealth: Payer: Self-pay | Admitting: Nurse Practitioner

## 2014-07-22 ENCOUNTER — Encounter: Payer: Self-pay | Admitting: Nurse Practitioner

## 2014-07-22 ENCOUNTER — Ambulatory Visit (INDEPENDENT_AMBULATORY_CARE_PROVIDER_SITE_OTHER): Payer: No Typology Code available for payment source | Admitting: Nurse Practitioner

## 2014-07-22 VITALS — BP 96/60 | HR 92 | Temp 98.2°F | Resp 18 | Wt 132.0 lb

## 2014-07-22 DIAGNOSIS — B373 Candidiasis of vulva and vagina: Secondary | ICD-10-CM

## 2014-07-22 DIAGNOSIS — B3731 Acute candidiasis of vulva and vagina: Secondary | ICD-10-CM

## 2014-07-22 MED ORDER — FLUCONAZOLE 150 MG PO TABS: 150.0000 mg | ORAL_TABLET | Freq: Once | ORAL | Status: DC

## 2014-07-22 NOTE — Patient Instructions (Signed)

## 2014-07-22 NOTE — Progress Notes (Signed)
58 y.o. WM Fe G4P3 here with complaints of vaginal symptoms with irritation and redness of vulva for 2-3 days. Last pm had an increase discharge. Describes discharge as white cottage. She is on bio-identical HRT.  New dose of bio-identiacal HRT with estrogen and testosterone pellet and progesterone tablet ws given on 06/29/14.  Has been on this for a year.  She does note sometimes feels like yeast infection occurs after the injection. Denies new personal products or vaginal dryness. No STD concerns. Urinary symptoms none.   O: Healthy female WDWN Affect: normal, orientation x 3  Exam: Abdomen: soft and non tender Lymph node: no enlargement or tenderness Pelvic exam: External genital:  BUS: negative Vagina: white thick discharge noted.  Adnexa:normal, non tender, no masses or fullness noted   Wet Prep results: PH: 4.5; NSS: negative; KOH: + yeast   A: Yeast vaginitis  On Bio identical HRT   P: Discussed findings of yeast and etiology. Discussed Aveeno or baking soda sitz bath for comfort. Avoid moist clothes or pads for extended period of time. If working out in gym clothes or swim suits for long periods of time change underwear or bottoms of swimsuit if possible. Olive Oil use for skin protection prior to activity can be used to external skin.  Rx: Diflucan 150 mg X 2  RV prn

## 2014-07-22 NOTE — Telephone Encounter (Signed)
Patient has some questions about instructions printed in her AVS. Patient was treated for a yeast infection. The instruction say to abstain from intercourse until she finished the medication and that her partner should be treated. Is this correct?

## 2014-07-25 ENCOUNTER — Other Ambulatory Visit: Payer: Self-pay | Admitting: Dermatology

## 2014-07-25 ENCOUNTER — Encounter: Payer: Self-pay | Admitting: Nurse Practitioner

## 2014-07-25 NOTE — Telephone Encounter (Signed)
Patient called, said she really needed to know on Friday afternoon if she had to abstain from intercourse over the weekend and if partner had to be treated.  Advised partner does not need to be treated unless patient having many yeast infections and unable to clear, one yeast infection does not indicate need to treat partner.  Patient has taken diflucan x 2 and symptoms have lessened. Advised patient may need more time for symptoms to be completely gone, 4-5 days after second diflucan.  Patient will return call if any further symptoms.   Routing to provider for final review. Patient agreeable to disposition. Will close encounter

## 2014-07-25 NOTE — Progress Notes (Signed)
Encounter reviewed by Dr. Jenae Tomasello Silva.  

## 2014-07-28 NOTE — Telephone Encounter (Signed)
Spoke with patient and notified and pap smear results.

## 2014-07-28 NOTE — Telephone Encounter (Signed)
Patient notified

## 2014-08-05 ENCOUNTER — Telehealth: Payer: Self-pay | Admitting: Obstetrics and Gynecology

## 2014-08-05 NOTE — Telephone Encounter (Signed)
Encounter closed

## 2014-08-05 NOTE — Telephone Encounter (Signed)
Patient calling to check on status of urine results if we sent them to the lab from her first visit with Dr. Edward JollySilva on 07/08/14. Patient went to an urgent care last weekend and says she had blood in her urine.

## 2014-08-05 NOTE — Telephone Encounter (Signed)
Return call to patient. Mailbox full, unable to leave message. Urine culture from 07-08-14 was negative.

## 2014-08-05 NOTE — Telephone Encounter (Signed)
No other recommendations at this time.  I do recommend the urine test of cure. I hope she is feeling better!

## 2014-08-05 NOTE — Telephone Encounter (Signed)
Patient returned.call. Reports has expeiernced intermittent symptoms of UTI but they kept resolving spontaneously. This past weekend had to go to urgent care, was treated with Septra and has been called to say she had to be changed to Macrobid due to resistance. Recalls that was told at [facility] had blood in her urine. Was concerned that infection had been present since AEX 07-08-14. Advised that although urine dipstick in office was positive, urine micro was negative for blood and culture was negative. Encouraged to have TOC when completes Macrobid. She has been given 10 day treatment. States will have TOC at work and will fax results. Fax number given.  Routing to provider for final review. Anything else?

## 2014-09-01 ENCOUNTER — Telehealth: Payer: Self-pay

## 2014-09-01 NOTE — Telephone Encounter (Signed)
-----   Message from Armen PickupSarah S Yeakley, RN sent at 08/05/2014  5:36 PM EST ----- Regarding: urine TOC Do you keep a list of patient that should be having follow up TOC. This lady should be sending result is 2-3 weeks. Having done at her office and faxing result.

## 2014-09-01 NOTE — Telephone Encounter (Signed)
[phone] and left message for her to call me back.  Patient had UTI several weeks ago and needs TOC.  Patient was to have this done at work and fax results to our office but we have not received.  Called patient on cell # but "mailbox full", so called her office and Left message for her to call to discuss.

## 2014-09-01 NOTE — Telephone Encounter (Signed)
Pt returned call to our office.  States she has taken several tests and all have shown a trace of RBC.  She states she will take another test today and call us with those results.  Requested that patient fax results of today's test and any others to our office.  Pt given and confirmed fax number.  She will fax results when she has them.

## 2014-09-01 NOTE — Telephone Encounter (Signed)
-----   Message from Sarah S Yeakley, RN sent at 08/05/2014  5:36 PM EST ----- Regarding: urine TOC Do you keep a list of patient that should be having follow up TOC. This lady should be sending result is 2-3 weeks. Having done at her office and faxing result.  

## 2014-09-19 NOTE — Telephone Encounter (Signed)
Dr. Edward JollySilva,  Patient never faxed urine report to our office.  She had been Checking urines at work.  No further action?  Routed to Dr. Edward JollySilva.

## 2014-09-19 NOTE — Telephone Encounter (Signed)
Our office final urine micro and culture were negative.  No follow up is needed.

## 2015-05-24 ENCOUNTER — Other Ambulatory Visit: Payer: Self-pay

## 2015-05-24 DIAGNOSIS — Z1231 Encounter for screening mammogram for malignant neoplasm of breast: Secondary | ICD-10-CM

## 2015-06-14 ENCOUNTER — Ambulatory Visit: Payer: No Typology Code available for payment source

## 2015-09-06 ENCOUNTER — Ambulatory Visit
Admission: RE | Admit: 2015-09-06 | Discharge: 2015-09-06 | Disposition: A | Discharge status: Home / Self Care | Payer: BLUE CROSS/BLUE SHIELD | Source: Ambulatory Visit

## 2015-09-06 DIAGNOSIS — Z1231 Encounter for screening mammogram for malignant neoplasm of breast: Secondary | ICD-10-CM

## 2015-09-11 ENCOUNTER — Ambulatory Visit: Payer: No Typology Code available for payment source | Admitting: Obstetrics and Gynecology

## 2015-09-11 ENCOUNTER — Telehealth: Payer: Self-pay | Admitting: Obstetrics and Gynecology

## 2015-09-11 ENCOUNTER — Encounter: Payer: Self-pay | Admitting: Obstetrics and Gynecology

## 2015-09-11 NOTE — Telephone Encounter (Signed)
Patient called and cancelled her appointment with Dr. Edward JollySilva today for her AEX. She said, "My husband suffered a heart attack late Friday night and is having a heart-cath this morning. He is due to be discharged right at my appointment time. I will call back to see if the slot is still available if she stays in longer." Patient rescheduled for now to 10/02/2015.

## 2015-09-11 NOTE — Telephone Encounter (Signed)
Thank you :)

## 2015-10-02 ENCOUNTER — Ambulatory Visit: Payer: No Typology Code available for payment source | Admitting: Obstetrics and Gynecology

## 2015-11-09 ENCOUNTER — Encounter: Payer: Self-pay | Admitting: Obstetrics and Gynecology

## 2015-11-09 ENCOUNTER — Ambulatory Visit (INDEPENDENT_AMBULATORY_CARE_PROVIDER_SITE_OTHER): Payer: BLUE CROSS/BLUE SHIELD | Admitting: Obstetrics and Gynecology

## 2015-11-09 VITALS — BP 100/64 | HR 80 | Resp 18 | Ht 61.5 in | Wt 134.0 lb

## 2015-11-09 DIAGNOSIS — Z01419 Encounter for gynecological examination (general) (routine) without abnormal findings: Secondary | ICD-10-CM | POA: Diagnosis not present

## 2015-11-09 DIAGNOSIS — R635 Abnormal weight gain: Secondary | ICD-10-CM

## 2015-11-09 DIAGNOSIS — K5900 Constipation, unspecified: Secondary | ICD-10-CM | POA: Diagnosis not present

## 2015-11-09 DIAGNOSIS — M8580 Other specified disorders of bone density and structure, unspecified site: Secondary | ICD-10-CM

## 2015-11-09 DIAGNOSIS — F411 Generalized anxiety disorder: Secondary | ICD-10-CM

## 2015-11-09 MED ORDER — PHENTERMINE HCL 37.5 MG PO CAPS: 37.5000 mg | ORAL_CAPSULE | ORAL | Status: DC

## 2015-11-09 NOTE — Progress Notes (Signed)
Patient ID: Ruth Townsend, female   DOB: 10-29-1955, 60 y.o.   MRN: [id number] 60 y.o. G30P3013 Married Caucasian female here for annual exam.    Has 5 issues: 1 - Weight issues.  Cannot lose her last 5 pounds.  Asking for weight loss medication.  Wants to control her cravings.  Used phentermine in the past.  2 - Feels tired.  Feels this is more than she should be.  No energy to take a shower at night.  Had hypothyroidism.  Has blood work every 3 months through [facility].  TFTs normal.  No Vit D check.  Will do this in March. 3 - Constipation.  Whole life problem. Can go for days without a BM.  Uses probiotics regularly.   4 - Double voiding.  Not leaking.  5 - A lot going on the last few years.  Some increased anxiety.  Not wanting to go the the mail box to see what she needs to do.  Lost her dad.  Not sure if she needs something occasionally to deal with anxiety.  Does not want to take something daily. Tried SSRIs in the past.  Has insomnia.   Sees [facility] for weight loss.  Does labs [facility].   [facility]  Patient's last menstrual period was 09/24/2003.          Sexually active: Yes.   female The current method of family planning is post menopausal status.    Exercising: No.   Smoker:  no  Health Maintenance: Pap:  07-08-14 ASCUS:Neg HR HPV History of abnormal Pap:  Yes, Hx cryotherapy in her late teens; paps normal since. MMG:  09-06-15 3D/Density Cat.C/Neg/BiRads1:[facility] Colonoscopy:  02-27-11 normal with Dr. Erskine Emery due 02/2021. BMD:   Years ago  Result:Osteopenia:with Orthopedic in [place] after falling and breaking Lt.foot and Rt.ankle. TDaP:  Up to date through employer Screening Labs:  Hb today: Labs with [facility], Urine today:  Trace RBCs.  Asymptomatic.    reports that she quit smoking about 10 years ago. She has never used smokeless tobacco. She reports that she does not drink alcohol or use illicit drugs.  Past Medical History  Diagnosis  Date  . Hypothyroidism   . Migraines   . GERD (gastroesophageal reflux disease)   . Diverticulosis   . Colon polyp     Past Surgical History  Procedure Laterality Date  . Broken leg  2000    Broken Left foot and broken Rt. ankle--[place]  . Heart ablation      Childhood    Current Outpatient Prescriptions  Medication Sig Dispense Refill  . ARMOUR THYROID 60 MG tablet Take 1 tablet by mouth daily.  0  . SUMAtriptan (IMITREX) 100 MG tablet Take 100 mg by mouth every 2 (two) hours as needed.      Marland Kitchen VITAMIN D, CHOLECALCIFEROL, PO Take 5,000 Units by mouth daily.     . phentermine 37.5 MG capsule Take 1 capsule (37.5 mg total) by mouth every morning. 30 capsule 0   Current Facility-Administered Medications  Medication Dose Route Frequency Provider Last Rate Last Dose  . 0.9 %  sodium chloride infusion  500 mL Intravenous Continuous Rachael Fee, MD        Family History  Problem Relation Age of Onset  . Macular degeneration Father     Dec 2016 age 80  . Glaucoma Mother   . Stroke Mother   . Heart attack Paternal Grandfather  ROS:  Pertinent items are noted in HPI.  Otherwise, a comprehensive ROS was negative.  Exam:   BP 100/64 mmHg  Pulse 80  Resp 18  Ht 5' 1.5" (1.562 m)  Wt 134 lb (60.782 kg)  BMI 24.91 kg/m2  LMP 09/24/2003    General appearance: alert, cooperative and appears stated age Head: Normocephalic, without obvious abnormality, atraumatic Neck: no adenopathy, supple, symmetrical, trachea midline and thyroid normal to inspection and palpation Lungs: clear to auscultation bilaterally Breasts: No dominant masses, retractions, nipple discharge, or axillary adenopathy. Heart: regular rate and rhythm Abdomen: soft, non-tender; bowel sounds normal; no masses,  no organomegaly Extremities: extremities normal, atraumatic, no cyanosis or edema Skin: Skin color, texture, turgor normal. No rashes or lesions Lymph nodes: Cervical, supraclavicular, and  axillary nodes normal. No abnormal inguinal nodes palpated Neurologic: Grossly normal  Pelvic: External genitalia:  no lesions              Urethra:  normal appearing urethra with no masses, tenderness or lesions              Bartholins and Skenes: normal                 Vagina: normal appearing vagina with normal color and discharge, no lesions              Cervix: no lesions              Pap taken: Yes.   Bimanual Exam:  Uterus:  normal size, contour, position, consistency, mobility, non-tender              Adnexa: normal adnexa and no mass, fullness, tenderness              Rectovaginal: Yes.  .  Confirms.              Anus:  normal sphincter tone, no lesions  Chaperone was present for exam.  Assessment:   Well woman visit with normal exam. Anxiety. Constipation.  Fatigue.  Weight gain.  Osteopenia.   Plan: Yearly mammogram recommended after age 68.  Recommended self breast exam.  Pap and HR HPV as above. Discussed Calcium, Vitamin D, regular exercise program including cardiovascular and weight bearing exercise.  I discussed the importance of exercise for well being, weight loss, constipation improvement, and good bone health.  Labs performed.  No..   L[facility] through [facility].  Rx for Phentermine 37.5 mg daily.  #30, RF none.  She understands that she will not receive refills of this medication without a weight check and office visit with me.  She understands that this may increase her blood pressure and anxiety symptoms.  She declines SSRIs. No benzodiazepine given. Information given about [place] counseling.  She will consider.  Miralax or Colace daily.  She will call her GI to see when she is due again for her next colonoscopy.  Order placed for bone density at [facility].  Patient will call to schedule. Follow up annually and prn.   15 minutes of additional counseling given regarding fatigue, constipation, anxiety, weight gain.  Over 50% was spent in counseling.    After visit summary provided.

## 2015-11-13 LAB — IPS PAP TEST WITH HPV

## 2016-06-27 ENCOUNTER — Other Ambulatory Visit: Payer: Self-pay | Admitting: Obstetrics and Gynecology

## 2016-06-27 DIAGNOSIS — Z1231 Encounter for screening mammogram for malignant neoplasm of breast: Secondary | ICD-10-CM

## 2016-07-18 ENCOUNTER — Ambulatory Visit (INDEPENDENT_AMBULATORY_CARE_PROVIDER_SITE_OTHER): Payer: Worker's Compensation

## 2016-07-18 ENCOUNTER — Ambulatory Visit (INDEPENDENT_AMBULATORY_CARE_PROVIDER_SITE_OTHER): Payer: Worker's Compensation | Admitting: Orthopaedic Surgery

## 2016-07-18 ENCOUNTER — Encounter (INDEPENDENT_AMBULATORY_CARE_PROVIDER_SITE_OTHER): Payer: Self-pay | Admitting: Orthopaedic Surgery

## 2016-07-18 ENCOUNTER — Telehealth (INDEPENDENT_AMBULATORY_CARE_PROVIDER_SITE_OTHER): Payer: Self-pay | Admitting: *Deleted

## 2016-07-18 DIAGNOSIS — G8929 Other chronic pain: Secondary | ICD-10-CM | POA: Diagnosis not present

## 2016-07-18 DIAGNOSIS — M25561 Pain in right knee: Secondary | ICD-10-CM

## 2016-07-18 MED ORDER — CELECOXIB 200 MG PO CAPS: 200.0000 mg | ORAL_CAPSULE | Freq: Two times a day (BID) | ORAL | 1 refills | Status: DC | PRN

## 2016-07-18 NOTE — Progress Notes (Signed)
Office Visit Note   Patient: Greggory BrandyRebecca C Coval           Date of Birth: 1955/11/27           MRN: [id number] Visit Date: 07/18/2016              Requested by: No referring provider defined for this encounter. PCP: PROVIDER NOT IN SYSTEM   Assessment & Plan: Visit Diagnoses:  1. 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   Orders:  Orders Placed This Encounter  Procedures  . XR KNEE 3 VIEW RIGHT   Meds ordered this encounter  Medications  . celecoxib (CELEBREX) 200 MG capsule    Sig: Take 1 capsule (200 mg total) by mouth 2 (two) times daily as needed.    Dispense:  60 capsule    Refill:  1      Procedures: No procedures performed   Clinical Data: No additional findings.   Subjective: Chief Complaint  Patient presents with  . Right Knee - Pain, Follow-up, Injury    FELL THIS YEAR, DOESN'T REMEMBER THE DATE    60 yo female with a few month h/o right knee pain at work.  She fell directly onto anterior knee and for a few months had a "knot" on her patella.  This has resolved.  She states she feels like something is loose or moving around in her knee but denies any catching or mechanical symptoms.  Mainly endorses pain that's worse with going up stairs.  Pain doesn't radiate.  Denies any swelling.    Review of Systems  Constitutional: Negative.   HENT: Negative.   Eyes: Negative.   Respiratory: Negative.   Cardiovascular: Negative.   Endocrine: Negative.   Musculoskeletal: Negative.   Neurological: Negative.   Hematological: Negative.   Psychiatric/Behavioral: Negative.   All other systems reviewed and are negative.    Objective: Vital Signs: LMP 09/24/2003   Physical Exam  Constitutional: She is oriented to person, place, and time. She  appears well-developed and well-nourished.  HENT:  Head: Atraumatic.  Eyes: EOM are normal.  Neck: Neck supple.  Cardiovascular: Intact distal pulses.   Pulmonary/Chest: Effort normal.  Abdominal: Soft.  Musculoskeletal:       Right knee: She exhibits no effusion.  Neurological: She is alert and oriented to person, place, and time.  Skin: Skin is warm. Capillary refill takes less than 2 seconds.  Psychiatric: She has a normal mood and affect. Her behavior is normal. Judgment and thought content normal.  Nursing note and vitals reviewed.   Right Knee Exam  Right knee exam is normal.  Tenderness  The patient is experiencing no tenderness.     Range of Motion  The patient has normal right knee ROM.  Tests  Lachman:  Anterior - negative     Drawer:       Anterior - negative     Varus: negative Valgus: negative Pivot Shift: negative Patellar Apprehension: negative  Other  Sensation: normal Pulse: present Swelling: none Other tests: no effusion present      Specialty Comments:  No specialty comments available.  Imaging: Xr Knee 3 View Right  Result Date: 07/18/2016 Negative for acute findings, DJD, or previous trauma    PMFS History: Patient Active Problem  List   Diagnosis Date Noted  . Diverticulosis of colon without hemorrhage 05/06/2013  . H/O: duodenal ulcer 05/06/2013  . History of migraine headaches 05/06/2013  . Unspecified hypothyroidism 05/06/2013   Past Medical History:  Diagnosis Date  . Colon polyp   . Diverticulosis   . GERD (gastroesophageal reflux disease)   . History of abnormal cervical Pap smear    --hx colposcopy/cryotherapy to cervix in her late teens--paps normal since  . Hypothyroidism   . Migraines     Family History  Problem Relation Age of Onset  . Macular degeneration Father     Dec 2016 age 50  . Glaucoma Mother   . Stroke Mother   . Heart attack Paternal Grandfather     Past Surgical History:  Procedure  Laterality Date  . Broken leg  2000   Broken Left foot and broken Rt. ankle--[place]  . 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 Smoking status: Former Smoker    Quit date: 05/22/2005  . Smokeless tobacco: Never Used  . Alcohol use No     Comment: rarely  . Drug use: No  . Sexual activity: Yes    Partners: Male    Birth control/ protection: Post-menopausal

## 2016-07-19 NOTE — Telephone Encounter (Signed)
ERROR

## 2016-09-02 ENCOUNTER — Telehealth (INDEPENDENT_AMBULATORY_CARE_PROVIDER_SITE_OTHER): Payer: Self-pay | Admitting: Orthopaedic Surgery

## 2016-09-10 ENCOUNTER — Ambulatory Visit: Payer: BLUE CROSS/BLUE SHIELD

## 2016-09-18 NOTE — Telephone Encounter (Signed)
ERROR

## 2016-11-11 ENCOUNTER — Ambulatory Visit: Payer: Self-pay | Admitting: Obstetrics and Gynecology

## 2016-11-19 ENCOUNTER — Ambulatory Visit: Payer: BLUE CROSS/BLUE SHIELD

## 2016-11-20 ENCOUNTER — Ambulatory Visit
Admission: RE | Admit: 2016-11-20 | Discharge: 2016-11-20 | Disposition: A | Discharge status: Home / Self Care | Payer: BLUE CROSS/BLUE SHIELD | Source: Ambulatory Visit | Attending: Obstetrics and Gynecology | Admitting: Obstetrics and Gynecology

## 2016-11-20 DIAGNOSIS — Z1231 Encounter for screening mammogram for malignant neoplasm of breast: Secondary | ICD-10-CM

## 2016-12-09 ENCOUNTER — Ambulatory Visit (INDEPENDENT_AMBULATORY_CARE_PROVIDER_SITE_OTHER): Payer: BLUE CROSS/BLUE SHIELD | Admitting: Obstetrics and Gynecology

## 2016-12-09 ENCOUNTER — Encounter: Payer: Self-pay | Admitting: Obstetrics and Gynecology

## 2016-12-09 VITALS — BP 104/70 | HR 80 | Resp 14 | Ht 61.0 in | Wt 138.4 lb

## 2016-12-09 DIAGNOSIS — R319 Hematuria, unspecified: Secondary | ICD-10-CM

## 2016-12-09 DIAGNOSIS — Z0000 Encounter for general adult medical examination without abnormal findings: Secondary | ICD-10-CM | POA: Diagnosis not present

## 2016-12-09 DIAGNOSIS — R635 Abnormal weight gain: Secondary | ICD-10-CM | POA: Diagnosis not present

## 2016-12-09 DIAGNOSIS — Z01419 Encounter for gynecological examination (general) (routine) without abnormal findings: Secondary | ICD-10-CM

## 2016-12-09 LAB — POCT URINALYSIS DIPSTICK
BILIRUBIN UA: NEGATIVE
Glucose, UA: NEGATIVE
KETONES UA: NEGATIVE
LEUKOCYTES UA: NEGATIVE
Nitrite, UA: NEGATIVE
Protein, UA: NEGATIVE
Urobilinogen, UA: NEGATIVE (ref ?–2.0)
pH, UA: 5 (ref 5.0–8.0)

## 2016-12-09 MED ORDER — PHENTERMINE HCL 37.5 MG PO CAPS: 37.5000 mg | ORAL_CAPSULE | ORAL | 0 refills | Status: AC

## 2016-12-09 NOTE — Progress Notes (Signed)
61 y.o. [id number] Married Caucasian female here for annual exam.    Wants to take Phentermine again to stop her cravings.  Gained about 4 pounds since last year.  Having more headaches and is eating more carbs.  [facility].  Worried about urinary incontinence as her mother has this. No leakage now.   Asking about abdominal aortic aneurysm.  Her mom had one.  States she has a bulge that appears under her left costal margin area.  [facility].  Now on testosterone only - pellets in buttocks.  Just had a blood draw at work for a needle stick.  Daughter is marrying in June.  Has exercise equipment at home.   PCP:  Henreitta Leberandice Clark, PA   Patient's last menstrual period was 09/24/2003.           Sexually active: Yes.    The current method of family planning is post menopausal status.    Exercising: No.   Smoker:  no  Health Maintenance: Pap:11-09-15 Neg:Neg HR HPV; 07-08-14 Ascus:Neg HR HPV  History of abnormal Pap:  Yes, Hx cryotherapy in her late teens; paps normal since.  MMG:  11-20-16 Density B/Neg/Birads1:TBD Colonoscopy: 02-27-11 normal with Dr. Erskine EmeryJacobs;next due 02/2021.   BMD: Years ago  Result: Osteopenia:[place]opedic [place] and breaking Lt.foot and Rt.ankle.   [place].   TDaP:  Up to date through [employer]:   N/A HIV and hep C: tested through work at [employer].  Screening Labs:  Hb today: [facility], Urine today: Trace RBCs--asymptomatic   reports that she quit smoking about 11 years ago. She has never used smokeless tobacco. She reports that she does not drink alcohol or use drugs.  Past Medical History:  Diagnosis Date  . Colon polyp   . Diverticulosis   . GERD (gastroesophageal reflux disease)   . History of abnormal cervical Pap smear    --hx colposcopy/cryotherapy to cervix in her late teens--paps normal since  . Hypothyroidism   .  Migraines     Past Surgical History:  Procedure Laterality Date  . Broken leg  2000   Broken Left foot and broken Rt. ankle--[place]  . Heart Ablation     Childhood    Current Outpatient Prescriptions  Medication Sig Dispense Refill  . ARMOUR THYROID 60 MG tablet Take 1 tablet by mouth daily.  0  . omeprazole (PRILOSEC) 10 MG capsule Take 10 mg by mouth daily.    Marland Kitchen. PRESCRIPTION MEDICATION Compounded Testosterone pellets    . SUMAtriptan (IMITREX) 100 MG tablet Take 100 mg by mouth every 2 (two) hours as needed.      Marland Kitchen. VITAMIN D, CHOLECALCIFEROL, PO Take 5,000 Units by mouth daily.     . phentermine 37.5 MG capsule Take 1 capsule (37.5 mg total) by mouth every morning. 30 capsule 0   Current Facility-Administered Medications  Medication Dose Route Frequency Provider Last Rate Last Dose  . 0.9 %  sodium chloride infusion  500 mL Intravenous Continuous Rachael Feeaniel P Jacobs, MD        Family History  Problem Relation Age of Onset  . Macular degeneration Father     Dec 2016 age 190  . Glaucoma Mother   . Stroke Mother   . Heart attack Paternal Grandfather   . Breast cancer Neg Hx     ROS:  Pertinent items  are noted in HPI.  Otherwise, a comprehensive ROS was negative.  Exam:   BP 104/70 (BP Location: Right Arm, Patient Position: Sitting, Cuff Size: Normal)   Pulse 80   Resp 14   Ht 5\' 1"  (1.549 m)   Wt 138 lb 6.4 oz (62.8 kg)   LMP 09/24/2003   BMI 26.15 kg/m     General appearance: alert, cooperative and appears stated age Head: Normocephalic, without obvious abnormality, atraumatic Neck: no adenopathy, supple, symmetrical, trachea midline and thyroid normal to inspection and palpation Lungs: clear to auscultation bilaterally Breasts: normal appearance, no masses or tenderness, No nipple retraction or dimpling, No nipple discharge or bleeding, No axillary or supraclavicular adenopathy Heart: regular rate and rhythm Abdomen: soft, non-tender; no masses, no  organomegaly Extremities: extremities normal, atraumatic, no cyanosis or edema Skin: Skin color, texture, turgor normal. No rashes or lesions Lymph nodes: Cervical, supraclavicular, and axillary nodes normal. No abnormal inguinal nodes palpated Neurologic: Grossly normal  Pelvic: External genitalia:  no lesions              Urethra:  normal appearing urethra with no masses, tenderness or lesions              Bartholins and Skenes: normal                 Vagina: normal appearing vagina with normal color and discharge, no lesions              Cervix: no lesions              Pap taken: Yes.   Bimanual Exam:  Uterus:  normal size, contour, position, consistency, mobility, non-tender              Adnexa: no mass, fullness, tenderness              Rectal exam: Yes.  .  Confirms.              Anus:  normal sphincter tone, no lesions  Chaperone was present for exam.  Assessment:   Well woman visit with normal exam. Hx ACUS and negative HR HPV 2015. Osteopenia.  Testosterone therapy.  Hypothyroidism. Weight gain.  Microscopic hematuria.   Plan: Mammogram screening discussed. Recommended self breast awareness. Opted to do Pap and HR HPV today.  Guidelines for Calcium, Vitamin D, regular exercise program including cardiovascular and weight bearing exercise. She will do BMD in [facility] through her PCP. Rx for Phentermine 37.5 mg daily.   #30, RF none. Urine micro and culture.  We talked about her taking an OTC AZO standard and placing a panty shield.  If it turns red/orange, she may have incontinence. Follow up annually and prn.  Needs a recheck appointment in 1 month to continue on Phentermine.      After visit summary provided.

## 2016-12-09 NOTE — Patient Instructions (Signed)

## 2016-12-10 LAB — URINALYSIS, MICROSCOPIC ONLY
BACTERIA UA: NONE SEEN [HPF]
CRYSTALS: NONE SEEN [HPF]
Casts: NONE SEEN [LPF]
RBC / HPF: NONE SEEN RBC/HPF (ref <=2)
WBC UA: NONE SEEN WBC/HPF (ref <=5)
Yeast: NONE SEEN [HPF]

## 2016-12-11 ENCOUNTER — Telehealth: Payer: Self-pay | Admitting: *Deleted

## 2016-12-11 LAB — URINE CULTURE: ORGANISM ID, BACTERIA: NO GROWTH

## 2016-12-11 NOTE — Telephone Encounter (Signed)
Left message to call Jazzalynn Rhudy at [phone].  

## 2016-12-11 NOTE — Telephone Encounter (Signed)
-----   Message from Patton SallesBrook E Amundson C Silva, MD sent at 12/11/2016 11:49 AM EDT ----- Please report to the patient that her final urine micro and culture were negative for blood and negative for infection.

## 2016-12-13 NOTE — Telephone Encounter (Signed)
Left message to call Dhruti Ghuman at [phone].  

## 2016-12-16 LAB — IPS PAP TEST WITH HPV

## 2016-12-16 NOTE — Telephone Encounter (Signed)
Spoke with patient, advised of results as seen below per Dr. Edward JollySilva. Patient verbalizes understanding.  Routing to provider for final review. Patient is agreeable to disposition. Will close encounter.

## 2017-05-08 ENCOUNTER — Ambulatory Visit (INDEPENDENT_AMBULATORY_CARE_PROVIDER_SITE_OTHER): Payer: BLUE CROSS/BLUE SHIELD | Admitting: Obstetrics and Gynecology

## 2017-05-08 ENCOUNTER — Telehealth: Payer: Self-pay | Admitting: Obstetrics and Gynecology

## 2017-05-08 VITALS — BP 104/66 | HR 90 | Ht 61.0 in | Wt 128.4 lb

## 2017-05-08 DIAGNOSIS — Z5181 Encounter for therapeutic drug level monitoring: Secondary | ICD-10-CM | POA: Diagnosis not present

## 2017-05-08 DIAGNOSIS — N6310 Unspecified lump in the right breast, unspecified quadrant: Secondary | ICD-10-CM | POA: Diagnosis not present

## 2017-05-08 NOTE — Telephone Encounter (Signed)
Reviewed with Dr. Edward JollySilva- ok to work into today's schedule.   Call to patient, left detailed message. Advised can work into Dr. Rica RecordsSilva's schedule for the afternoon, please return call to LoloJill at [phone].

## 2017-05-08 NOTE — Progress Notes (Signed)
GYNECOLOGY  VISIT   HPI: 61 y.o.   Married  Caucasian  female   [id number] with Patient's last menstrual period was 09/24/2003.   here for right breast bruise and lump.   Feels a knot in her breast since last night.  No known trauma.   Has easy bruising.   Not taking any blood thinners.   Wants a refill of her phentermine.  She is tolerating this well.  Uses it to control her appetite.  GYNECOLOGIC HISTORY: Patient's last menstrual period was 09/24/2003. Contraception:  Postmenopausal Menopausal hormone therapy:  none Last mammogram:  11-20-16 Density B/Neg/Birads1:TBC Last pap smear::11-09-15 Neg:Neg HR HPV; 07-08-14 Ascus:Neg HR HPV           OB History    Gravida Para Term Preterm AB Living   4 3 3   1 3    SAB TAB Ectopic Multiple Live Births                     Patient Active Problem List   Diagnosis Date Noted  . Diverticulosis of colon without hemorrhage 05/06/2013  . H/O: duodenal ulcer 05/06/2013  . History of migraine headaches 05/06/2013  . Unspecified hypothyroidism 05/06/2013    Past Medical History:  Diagnosis Date  . Colon polyp   . Diverticulosis   . GERD (gastroesophageal reflux disease)   . History of abnormal cervical Pap smear    --hx colposcopy/cryotherapy to cervix in her late teens--paps normal since  . Hypothyroidism   . Migraines     Past Surgical History:  Procedure Laterality Date  . Broken leg  2000   Broken Left foot and broken Rt. ankle--[place]  . Heart Ablation     Childhood    Current Outpatient Prescriptions  Medication Sig Dispense Refill  . ARMOUR THYROID 60 MG tablet Take 1 tablet by mouth daily.  0  . omeprazole (PRILOSEC) 10 MG capsule Take 10 mg by mouth daily.    . phentermine 37.5 MG capsule Take 1 capsule (37.5 mg total) by mouth every morning. 30 capsule 0  . PRESCRIPTION MEDICATION Compounded Testosterone pellets    . SUMAtriptan (IMITREX) 100 MG tablet Take 100 mg by mouth every 2 (two) hours as needed.       Marland Kitchen. VITAMIN D, CHOLECALCIFEROL, PO Take 5,000 Units by mouth daily.      Current Facility-Administered Medications  Medication Dose Route Frequency Provider Last Rate Last Dose  . 0.9 %  sodium chloride infusion  500 mL Intravenous Continuous Rachael FeeJacobs, Daniel P, MD         ALLERGIES: Patient has no known allergies.  Family History  Problem Relation Age of Onset  . Macular degeneration Father        Dec 2016 age 61  . Glaucoma Mother   . Stroke Mother   . Heart attack Paternal Grandfather   . Breast cancer Neg Hx     Social History   Social History  . Marital status: Married    Spouse name: N/A  . Number of children: 3  . 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 Smoking status: Former Smoker    Quit date: 05/22/2005  . Smokeless tobacco: Never Used  . Alcohol use No     Comment: rarely  . Drug use: No  . Sexual activity: Yes    Partners: Male    Birth control/ protection: Post-menopausal   Other Topics  Concern  . Not on file   Social History Narrative  . No narrative on file    ROS:  Pertinent items are noted in HPI.  PHYSICAL EXAMINATION:    BP 104/66 (BP Location: Right Arm, Patient Position: Sitting, Cuff Size: Small)   Pulse 90   Ht 5\' 1"  (1.549 m)   Wt 128 lb 6.4 oz (58.2 kg)   LMP 09/24/2003   BMI 24.26 kg/m     General appearance: alert, cooperative and appears stated age   Breasts: Right breast with 2.5 cm mass, smooth borders at 1 - 2:00 position with overlying ecchymoses, nontender, no enlarged axillary nodes.  Left breast with normal appearance, no masses or tenderness, No nipple retraction or dimpling, No nipple discharge or bleeding, No axillary or supraclavicular adenopathy Heart: regular rate and rhythm Lungs:  CTA bilaterally.   Chaperone was present for exam.  ASSESSMENT  Right breast mass.  Monitoring of medication for weight loss.  PLAN  Dx right mammogram and right  breast ultrasound. After study back and if ok will give Rx for Phentermine 37.5 mg, #30, RF none.    An After Visit Summary was printed and given to the patient.  __15____ minutes face to face time of which over 50% was spent in counseling.

## 2017-05-08 NOTE — Telephone Encounter (Signed)
Patient left message requesting to speak to an nurse.  No other information given.

## 2017-05-08 NOTE — Progress Notes (Signed)
Patient scheduled while in office. Spoke with [facility] at [facility]. Patient scheduled for right breast diagnostic MMG and US on 8/17 arriving at 11:10 for 11:30am appointment. Patient is agreeable to date and time.

## 2017-05-08 NOTE — Telephone Encounter (Signed)
Spoke with patient. Reports yellow bruise on right breast for approximately 10 days. Noticed lump under bruise last night. Patient unsure of how the bruise happened, "bruises easily". Patient asking if lump r/t bruise? Recommended OV for further evaluation. Patient available this afternoon. Advised patient would review schedule with Dr. Edward JollySilva and return call, patient is agreeable.    Dr. Edward JollySilva -please review and advise?

## 2017-05-08 NOTE — Telephone Encounter (Signed)
Spoke with patient. Advised patient she would be worked into Dr. Rica RecordsSilva's schedule, could she be here around 3pm? Patient states she was hoping to be seen later in the day, will see if she can leave work early, will return call to schedule.

## 2017-05-08 NOTE — Telephone Encounter (Signed)
Patient returned call, patient scheduled for 3pm today with Dr. Edward JollySilva. Patient is on her way to office. Verbalizes understanding and is agreeable.  Patient is agreeable to disposition. Will close encounter.

## 2017-05-09 ENCOUNTER — Ambulatory Visit
Admission: RE | Admit: 2017-05-09 | Discharge: 2017-05-09 | Disposition: A | Discharge status: Home / Self Care | Payer: BLUE CROSS/BLUE SHIELD | Source: Ambulatory Visit | Attending: Obstetrics and Gynecology | Admitting: Obstetrics and Gynecology

## 2017-05-09 DIAGNOSIS — N6310 Unspecified lump in the right breast, unspecified quadrant: Secondary | ICD-10-CM

## 2017-05-10 ENCOUNTER — Encounter: Payer: Self-pay | Admitting: Obstetrics and Gynecology

## 2017-07-02 ENCOUNTER — Telehealth: Payer: Self-pay | Admitting: Obstetrics and Gynecology

## 2017-07-02 NOTE — Telephone Encounter (Signed)
I am sorry if there was a misunderstanding regarding refills.  I mentioned to there that she needed to return for an office visit for a refill of the medication. She came in for another medical problem at her last office visit, and she asked me for a weight loss medication when we were in the Triage office wrapping up the visit.   The phentermine is a short term medication for up to 12 weeks, but it does need to be followed medically.  If she would like for me to refill, I will need to see her in the office.  It can have cardiovascular side effects.

## 2017-07-02 NOTE — Telephone Encounter (Signed)
Medication refill request: phentermine  Last AEX:  12-09-16  Next AEX: not scheduled yet Last MMG (if hormonal medication request): 05-09-17 Right breast U/S WNL Refill authorized: please advise

## 2017-07-02 NOTE — Telephone Encounter (Signed)
Patient needs office visit for an appointment for Phentermine refill.

## 2017-07-02 NOTE — Telephone Encounter (Signed)
Spoke with patient and gave message below. She states that she discuss this medication at her last visit in August. Patient states she has to pay out of pocket to come due to insurance and does not want to come back.  Advised that I would give the message to Dr. Edward Jolly and call her back

## 2017-07-02 NOTE — Telephone Encounter (Signed)
Phentermine refills needed per patient request.  Patient wants to pick up hard copy.

## 2017-07-03 NOTE — Telephone Encounter (Signed)
Spoke with patient and gave message from Dr. Edward Jolly. Patient voiced understanding.

## 2017-07-03 NOTE — Telephone Encounter (Signed)
Left message to return call -eh  

## 2017-08-19 ENCOUNTER — Telehealth: Payer: Self-pay | Admitting: Obstetrics and Gynecology

## 2017-08-19 NOTE — Telephone Encounter (Signed)
Patient will call back to reschedule med check appointment with Dr Edward JollySilva.

## 2017-08-21 NOTE — Telephone Encounter (Addendum)
Spoke with patient about rescheduling appointment with Dr.Silva for medication check. Patient states when she was here in August she had mentioned she wanted Rx for Phentermine. Dr.Silva had suggested she return to office to discuss this further. Patient wanted to make appointment with another provider in our practice today if possible. After speaking with clinical supervisor, advised patient she would need to reschedule with Dr.Silva or see her PCP for this medication. Discussed with patient this was a medication that needed to be monitored and it wasn't something that was prescribed without provider discussing side effects. Patient did not make appointment at this time.

## 2017-08-22 ENCOUNTER — Ambulatory Visit: Payer: Self-pay | Admitting: Obstetrics and Gynecology

## 2018-03-06 ENCOUNTER — Other Ambulatory Visit: Payer: Self-pay | Admitting: Obstetrics and Gynecology

## 2018-03-06 DIAGNOSIS — Z1231 Encounter for screening mammogram for malignant neoplasm of breast: Secondary | ICD-10-CM

## 2018-04-02 ENCOUNTER — Inpatient Hospital Stay: Admission: RE | Admit: 2018-04-02 | Payer: BLUE CROSS/BLUE SHIELD | Source: Ambulatory Visit

## 2018-09-08 ENCOUNTER — Ambulatory Visit: Payer: BLUE CROSS/BLUE SHIELD

## 2018-10-08 ENCOUNTER — Ambulatory Visit: Payer: BLUE CROSS/BLUE SHIELD

## 2018-11-08 DIAGNOSIS — J029 Acute pharyngitis, unspecified: Secondary | ICD-10-CM | POA: Diagnosis not present

## 2018-11-08 DIAGNOSIS — R070 Pain in throat: Secondary | ICD-10-CM | POA: Diagnosis not present

## 2018-11-24 ENCOUNTER — Ambulatory Visit
Admission: RE | Admit: 2018-11-24 | Discharge: 2018-11-24 | Disposition: A | Discharge status: Home / Self Care | Payer: 59 | Source: Ambulatory Visit | Attending: Obstetrics and Gynecology | Admitting: Obstetrics and Gynecology

## 2018-11-24 DIAGNOSIS — Z1231 Encounter for screening mammogram for malignant neoplasm of breast: Secondary | ICD-10-CM | POA: Diagnosis not present

## 2019-11-25 ENCOUNTER — Other Ambulatory Visit: Payer: Self-pay | Admitting: Obstetrics and Gynecology

## 2019-11-25 DIAGNOSIS — Z1231 Encounter for screening mammogram for malignant neoplasm of breast: Secondary | ICD-10-CM

## 2019-12-23 ENCOUNTER — Other Ambulatory Visit: Payer: Self-pay

## 2019-12-23 ENCOUNTER — Ambulatory Visit
Admission: RE | Admit: 2019-12-23 | Discharge: 2019-12-23 | Disposition: A | Discharge status: Home / Self Care | Payer: BC Managed Care – PPO | Source: Ambulatory Visit | Attending: Obstetrics and Gynecology | Admitting: Obstetrics and Gynecology

## 2019-12-23 DIAGNOSIS — Z1231 Encounter for screening mammogram for malignant neoplasm of breast: Secondary | ICD-10-CM

## 2020-11-24 ENCOUNTER — Other Ambulatory Visit: Payer: Self-pay | Admitting: Family

## 2020-11-24 ENCOUNTER — Other Ambulatory Visit: Payer: Self-pay | Admitting: Obstetrics and Gynecology

## 2020-11-24 DIAGNOSIS — Z1231 Encounter for screening mammogram for malignant neoplasm of breast: Secondary | ICD-10-CM

## 2021-01-18 ENCOUNTER — Ambulatory Visit: Payer: BC Managed Care – PPO

## 2021-02-26 ENCOUNTER — Ambulatory Visit
Admission: RE | Admit: 2021-02-26 | Discharge: 2021-02-26 | Disposition: A | Discharge status: Home / Self Care | Payer: BC Managed Care – PPO | Source: Ambulatory Visit | Attending: Family | Admitting: Family

## 2021-02-26 ENCOUNTER — Other Ambulatory Visit: Payer: Self-pay

## 2021-02-26 DIAGNOSIS — Z1231 Encounter for screening mammogram for malignant neoplasm of breast: Secondary | ICD-10-CM

## 2021-03-21 ENCOUNTER — Encounter: Payer: Self-pay | Admitting: Gastroenterology

## 2021-07-14 ENCOUNTER — Other Ambulatory Visit: Payer: Self-pay

## 2021-07-14 ENCOUNTER — Ambulatory Visit
Admission: EM | Admit: 2021-07-14 | Discharge: 2021-07-14 | Discharge status: Against Medical Advice | Payer: Medicare HMO

## 2021-08-24 DIAGNOSIS — J111 Influenza due to unidentified influenza virus with other respiratory manifestations: Secondary | ICD-10-CM | POA: Diagnosis not present

## 2021-09-05 DIAGNOSIS — L570 Actinic keratosis: Secondary | ICD-10-CM | POA: Diagnosis not present

## 2021-09-05 DIAGNOSIS — L814 Other melanin hyperpigmentation: Secondary | ICD-10-CM | POA: Diagnosis not present

## 2021-09-06 DIAGNOSIS — M461 Sacroiliitis, not elsewhere classified: Secondary | ICD-10-CM | POA: Diagnosis not present

## 2021-10-18 DIAGNOSIS — Z6825 Body mass index (BMI) 25.0-25.9, adult: Secondary | ICD-10-CM | POA: Diagnosis not present

## 2021-10-18 DIAGNOSIS — J329 Chronic sinusitis, unspecified: Secondary | ICD-10-CM | POA: Diagnosis not present

## 2021-11-01 DIAGNOSIS — R6882 Decreased libido: Secondary | ICD-10-CM | POA: Diagnosis not present

## 2021-11-01 DIAGNOSIS — N951 Menopausal and female climacteric states: Secondary | ICD-10-CM | POA: Diagnosis not present

## 2021-11-01 DIAGNOSIS — E063 Autoimmune thyroiditis: Secondary | ICD-10-CM | POA: Diagnosis not present

## 2021-11-01 DIAGNOSIS — N898 Other specified noninflammatory disorders of vagina: Secondary | ICD-10-CM | POA: Diagnosis not present

## 2021-11-15 DIAGNOSIS — M65331 Trigger finger, right middle finger: Secondary | ICD-10-CM | POA: Diagnosis not present

## 2021-11-15 DIAGNOSIS — M65341 Trigger finger, right ring finger: Secondary | ICD-10-CM | POA: Diagnosis not present

## 2021-12-03 DIAGNOSIS — L820 Inflamed seborrheic keratosis: Secondary | ICD-10-CM | POA: Diagnosis not present

## 2021-12-03 DIAGNOSIS — L570 Actinic keratosis: Secondary | ICD-10-CM | POA: Diagnosis not present

## 2021-12-03 DIAGNOSIS — L814 Other melanin hyperpigmentation: Secondary | ICD-10-CM | POA: Diagnosis not present

## 2021-12-03 DIAGNOSIS — D485 Neoplasm of uncertain behavior of skin: Secondary | ICD-10-CM | POA: Diagnosis not present

## 2021-12-03 DIAGNOSIS — D1801 Hemangioma of skin and subcutaneous tissue: Secondary | ICD-10-CM | POA: Diagnosis not present

## 2021-12-03 DIAGNOSIS — L821 Other seborrheic keratosis: Secondary | ICD-10-CM | POA: Diagnosis not present

## 2021-12-07 DIAGNOSIS — M461 Sacroiliitis, not elsewhere classified: Secondary | ICD-10-CM | POA: Diagnosis not present

## 2022-01-22 DIAGNOSIS — L905 Scar conditions and fibrosis of skin: Secondary | ICD-10-CM | POA: Diagnosis not present

## 2022-01-22 DIAGNOSIS — I788 Other diseases of capillaries: Secondary | ICD-10-CM | POA: Diagnosis not present

## 2022-01-22 DIAGNOSIS — D692 Other nonthrombocytopenic purpura: Secondary | ICD-10-CM | POA: Diagnosis not present

## 2022-01-22 DIAGNOSIS — D1801 Hemangioma of skin and subcutaneous tissue: Secondary | ICD-10-CM | POA: Diagnosis not present

## 2022-01-25 DIAGNOSIS — R519 Headache, unspecified: Secondary | ICD-10-CM | POA: Diagnosis not present

## 2022-01-25 DIAGNOSIS — B372 Candidiasis of skin and nail: Secondary | ICD-10-CM | POA: Diagnosis not present

## 2022-01-25 DIAGNOSIS — Z6825 Body mass index (BMI) 25.0-25.9, adult: Secondary | ICD-10-CM | POA: Diagnosis not present

## 2022-02-07 DIAGNOSIS — R5382 Chronic fatigue, unspecified: Secondary | ICD-10-CM | POA: Diagnosis not present

## 2022-02-07 DIAGNOSIS — N951 Menopausal and female climacteric states: Secondary | ICD-10-CM | POA: Diagnosis not present

## 2022-02-07 DIAGNOSIS — R6882 Decreased libido: Secondary | ICD-10-CM | POA: Diagnosis not present

## 2022-02-07 DIAGNOSIS — E063 Autoimmune thyroiditis: Secondary | ICD-10-CM | POA: Diagnosis not present

## 2022-02-15 DIAGNOSIS — R140 Abdominal distension (gaseous): Secondary | ICD-10-CM | POA: Diagnosis not present

## 2022-02-15 DIAGNOSIS — B372 Candidiasis of skin and nail: Secondary | ICD-10-CM | POA: Diagnosis not present

## 2022-02-15 DIAGNOSIS — Z2821 Immunization not carried out because of patient refusal: Secondary | ICD-10-CM | POA: Diagnosis not present

## 2022-02-15 DIAGNOSIS — G43C0 Periodic headache syndromes in child or adult, not intractable: Secondary | ICD-10-CM | POA: Diagnosis not present

## 2022-02-15 DIAGNOSIS — E559 Vitamin D deficiency, unspecified: Secondary | ICD-10-CM | POA: Diagnosis not present

## 2022-02-15 DIAGNOSIS — R221 Localized swelling, mass and lump, neck: Secondary | ICD-10-CM | POA: Diagnosis not present

## 2022-02-15 DIAGNOSIS — I6789 Other cerebrovascular disease: Secondary | ICD-10-CM | POA: Diagnosis not present

## 2022-02-15 DIAGNOSIS — R252 Cramp and spasm: Secondary | ICD-10-CM | POA: Diagnosis not present

## 2022-02-15 DIAGNOSIS — B001 Herpesviral vesicular dermatitis: Secondary | ICD-10-CM | POA: Diagnosis not present

## 2022-02-15 DIAGNOSIS — K219 Gastro-esophageal reflux disease without esophagitis: Secondary | ICD-10-CM | POA: Diagnosis not present

## 2022-02-15 DIAGNOSIS — I471 Supraventricular tachycardia: Secondary | ICD-10-CM | POA: Diagnosis not present

## 2022-02-15 DIAGNOSIS — Z0000 Encounter for general adult medical examination without abnormal findings: Secondary | ICD-10-CM | POA: Diagnosis not present

## 2022-02-25 ENCOUNTER — Other Ambulatory Visit: Payer: Self-pay | Admitting: Family

## 2022-02-25 DIAGNOSIS — Z1231 Encounter for screening mammogram for malignant neoplasm of breast: Secondary | ICD-10-CM

## 2022-03-05 ENCOUNTER — Ambulatory Visit
Admission: RE | Admit: 2022-03-05 | Discharge: 2022-03-05 | Disposition: A | Discharge status: Home / Self Care | Payer: Medicare HMO | Source: Ambulatory Visit | Attending: Family | Admitting: Family

## 2022-03-05 DIAGNOSIS — Z1231 Encounter for screening mammogram for malignant neoplasm of breast: Secondary | ICD-10-CM | POA: Diagnosis not present

## 2022-03-06 ENCOUNTER — Other Ambulatory Visit: Payer: Self-pay | Admitting: Family

## 2022-03-06 DIAGNOSIS — R928 Other abnormal and inconclusive findings on diagnostic imaging of breast: Secondary | ICD-10-CM

## 2022-03-14 ENCOUNTER — Ambulatory Visit
Admission: RE | Admit: 2022-03-14 | Discharge: 2022-03-14 | Disposition: A | Discharge status: Home / Self Care | Payer: Medicare HMO | Source: Ambulatory Visit | Attending: Family | Admitting: Family

## 2022-03-14 ENCOUNTER — Ambulatory Visit: Payer: Medicare HMO

## 2022-03-14 DIAGNOSIS — R221 Localized swelling, mass and lump, neck: Secondary | ICD-10-CM | POA: Diagnosis not present

## 2022-03-14 DIAGNOSIS — M65331 Trigger finger, right middle finger: Secondary | ICD-10-CM | POA: Diagnosis not present

## 2022-03-14 DIAGNOSIS — R928 Other abnormal and inconclusive findings on diagnostic imaging of breast: Secondary | ICD-10-CM | POA: Diagnosis not present

## 2022-03-14 DIAGNOSIS — R7989 Other specified abnormal findings of blood chemistry: Secondary | ICD-10-CM | POA: Diagnosis not present

## 2022-03-21 DIAGNOSIS — N951 Menopausal and female climacteric states: Secondary | ICD-10-CM | POA: Diagnosis not present

## 2022-03-21 DIAGNOSIS — N898 Other specified noninflammatory disorders of vagina: Secondary | ICD-10-CM | POA: Diagnosis not present

## 2022-03-21 DIAGNOSIS — Z6824 Body mass index (BMI) 24.0-24.9, adult: Secondary | ICD-10-CM | POA: Diagnosis not present

## 2022-04-04 DIAGNOSIS — N3000 Acute cystitis without hematuria: Secondary | ICD-10-CM | POA: Diagnosis not present

## 2022-04-04 DIAGNOSIS — H5213 Myopia, bilateral: Secondary | ICD-10-CM | POA: Diagnosis not present

## 2022-04-04 DIAGNOSIS — H2513 Age-related nuclear cataract, bilateral: Secondary | ICD-10-CM | POA: Diagnosis not present

## 2022-04-04 DIAGNOSIS — H524 Presbyopia: Secondary | ICD-10-CM | POA: Diagnosis not present

## 2022-04-04 DIAGNOSIS — H04123 Dry eye syndrome of bilateral lacrimal glands: Secondary | ICD-10-CM | POA: Diagnosis not present

## 2022-07-06 DIAGNOSIS — R300 Dysuria: Secondary | ICD-10-CM | POA: Diagnosis not present

## 2022-07-11 DIAGNOSIS — Z6824 Body mass index (BMI) 24.0-24.9, adult: Secondary | ICD-10-CM | POA: Diagnosis not present

## 2022-07-11 DIAGNOSIS — E063 Autoimmune thyroiditis: Secondary | ICD-10-CM | POA: Diagnosis not present

## 2022-07-11 DIAGNOSIS — N898 Other specified noninflammatory disorders of vagina: Secondary | ICD-10-CM | POA: Diagnosis not present

## 2022-07-11 DIAGNOSIS — M2550 Pain in unspecified joint: Secondary | ICD-10-CM | POA: Diagnosis not present

## 2022-07-11 DIAGNOSIS — N951 Menopausal and female climacteric states: Secondary | ICD-10-CM | POA: Diagnosis not present

## 2022-07-15 DIAGNOSIS — L308 Other specified dermatitis: Secondary | ICD-10-CM | POA: Diagnosis not present

## 2022-07-15 DIAGNOSIS — L570 Actinic keratosis: Secondary | ICD-10-CM | POA: Diagnosis not present

## 2022-09-27 DIAGNOSIS — R051 Acute cough: Secondary | ICD-10-CM | POA: Diagnosis not present

## 2022-09-27 DIAGNOSIS — J309 Allergic rhinitis, unspecified: Secondary | ICD-10-CM | POA: Diagnosis not present

## 2022-11-07 DIAGNOSIS — R6882 Decreased libido: Secondary | ICD-10-CM | POA: Diagnosis not present

## 2022-11-07 DIAGNOSIS — Z79890 Hormone replacement therapy (postmenopausal): Secondary | ICD-10-CM | POA: Diagnosis not present

## 2022-11-07 DIAGNOSIS — N951 Menopausal and female climacteric states: Secondary | ICD-10-CM | POA: Diagnosis not present

## 2022-11-07 DIAGNOSIS — Z6825 Body mass index (BMI) 25.0-25.9, adult: Secondary | ICD-10-CM | POA: Diagnosis not present

## 2022-11-07 DIAGNOSIS — M2550 Pain in unspecified joint: Secondary | ICD-10-CM | POA: Diagnosis not present

## 2022-11-15 DIAGNOSIS — M65331 Trigger finger, right middle finger: Secondary | ICD-10-CM | POA: Diagnosis not present

## 2022-11-15 DIAGNOSIS — M65341 Trigger finger, right ring finger: Secondary | ICD-10-CM | POA: Diagnosis not present

## 2022-12-27 DIAGNOSIS — L255 Unspecified contact dermatitis due to plants, except food: Secondary | ICD-10-CM | POA: Diagnosis not present

## 2022-12-27 DIAGNOSIS — R21 Rash and other nonspecific skin eruption: Secondary | ICD-10-CM | POA: Diagnosis not present

## 2023-01-10 DIAGNOSIS — J040 Acute laryngitis: Secondary | ICD-10-CM | POA: Diagnosis not present

## 2023-01-21 DIAGNOSIS — D225 Melanocytic nevi of trunk: Secondary | ICD-10-CM | POA: Diagnosis not present

## 2023-01-21 DIAGNOSIS — L821 Other seborrheic keratosis: Secondary | ICD-10-CM | POA: Diagnosis not present

## 2023-01-21 DIAGNOSIS — L2489 Irritant contact dermatitis due to other agents: Secondary | ICD-10-CM | POA: Diagnosis not present

## 2023-01-21 DIAGNOSIS — L814 Other melanin hyperpigmentation: Secondary | ICD-10-CM | POA: Diagnosis not present

## 2023-01-21 DIAGNOSIS — D1801 Hemangioma of skin and subcutaneous tissue: Secondary | ICD-10-CM | POA: Diagnosis not present

## 2023-01-22 DIAGNOSIS — H10413 Chronic giant papillary conjunctivitis, bilateral: Secondary | ICD-10-CM | POA: Diagnosis not present

## 2023-01-31 DIAGNOSIS — I471 Supraventricular tachycardia, unspecified: Secondary | ICD-10-CM | POA: Diagnosis not present

## 2023-01-31 DIAGNOSIS — R079 Chest pain, unspecified: Secondary | ICD-10-CM | POA: Diagnosis not present

## 2023-01-31 DIAGNOSIS — E039 Hypothyroidism, unspecified: Secondary | ICD-10-CM | POA: Diagnosis not present

## 2023-01-31 DIAGNOSIS — R002 Palpitations: Secondary | ICD-10-CM | POA: Diagnosis not present

## 2023-02-20 DIAGNOSIS — H16202 Unspecified keratoconjunctivitis, left eye: Secondary | ICD-10-CM | POA: Diagnosis not present

## 2023-02-20 DIAGNOSIS — M65341 Trigger finger, right ring finger: Secondary | ICD-10-CM | POA: Diagnosis not present

## 2023-02-20 DIAGNOSIS — H01114 Allergic dermatitis of left upper eyelid: Secondary | ICD-10-CM | POA: Diagnosis not present

## 2023-02-20 DIAGNOSIS — H01115 Allergic dermatitis of left lower eyelid: Secondary | ICD-10-CM | POA: Diagnosis not present

## 2023-02-20 DIAGNOSIS — M65331 Trigger finger, right middle finger: Secondary | ICD-10-CM | POA: Diagnosis not present

## 2023-02-27 DIAGNOSIS — R079 Chest pain, unspecified: Secondary | ICD-10-CM | POA: Diagnosis not present

## 2023-03-03 DIAGNOSIS — E559 Vitamin D deficiency, unspecified: Secondary | ICD-10-CM | POA: Diagnosis not present

## 2023-03-03 DIAGNOSIS — E663 Overweight: Secondary | ICD-10-CM | POA: Diagnosis not present

## 2023-03-05 DIAGNOSIS — Z1339 Encounter for screening examination for other mental health and behavioral disorders: Secondary | ICD-10-CM | POA: Diagnosis not present

## 2023-03-05 DIAGNOSIS — R0789 Other chest pain: Secondary | ICD-10-CM | POA: Diagnosis not present

## 2023-03-05 DIAGNOSIS — E559 Vitamin D deficiency, unspecified: Secondary | ICD-10-CM | POA: Diagnosis not present

## 2023-03-05 DIAGNOSIS — Z1331 Encounter for screening for depression: Secondary | ICD-10-CM | POA: Diagnosis not present

## 2023-03-05 DIAGNOSIS — N898 Other specified noninflammatory disorders of vagina: Secondary | ICD-10-CM | POA: Diagnosis not present

## 2023-03-05 DIAGNOSIS — Z8679 Personal history of other diseases of the circulatory system: Secondary | ICD-10-CM | POA: Diagnosis not present

## 2023-03-05 DIAGNOSIS — E063 Autoimmune thyroiditis: Secondary | ICD-10-CM | POA: Diagnosis not present

## 2023-03-05 DIAGNOSIS — Z79890 Hormone replacement therapy (postmenopausal): Secondary | ICD-10-CM | POA: Diagnosis not present

## 2023-03-05 DIAGNOSIS — Z6826 Body mass index (BMI) 26.0-26.9, adult: Secondary | ICD-10-CM | POA: Diagnosis not present

## 2023-03-05 DIAGNOSIS — R0602 Shortness of breath: Secondary | ICD-10-CM | POA: Diagnosis not present

## 2023-03-05 DIAGNOSIS — N951 Menopausal and female climacteric states: Secondary | ICD-10-CM | POA: Diagnosis not present

## 2023-03-05 DIAGNOSIS — I209 Angina pectoris, unspecified: Secondary | ICD-10-CM | POA: Diagnosis not present

## 2023-03-05 DIAGNOSIS — R079 Chest pain, unspecified: Secondary | ICD-10-CM | POA: Diagnosis not present

## 2023-03-05 DIAGNOSIS — E611 Iron deficiency: Secondary | ICD-10-CM | POA: Diagnosis not present

## 2023-03-05 DIAGNOSIS — E785 Hyperlipidemia, unspecified: Secondary | ICD-10-CM | POA: Diagnosis not present

## 2023-03-06 DIAGNOSIS — M791 Myalgia, unspecified site: Secondary | ICD-10-CM | POA: Diagnosis not present

## 2023-03-06 DIAGNOSIS — B372 Candidiasis of skin and nail: Secondary | ICD-10-CM | POA: Diagnosis not present

## 2023-03-06 DIAGNOSIS — M810 Age-related osteoporosis without current pathological fracture: Secondary | ICD-10-CM | POA: Diagnosis not present

## 2023-03-06 DIAGNOSIS — B001 Herpesviral vesicular dermatitis: Secondary | ICD-10-CM | POA: Diagnosis not present

## 2023-03-06 DIAGNOSIS — K219 Gastro-esophageal reflux disease without esophagitis: Secondary | ICD-10-CM | POA: Diagnosis not present

## 2023-03-06 DIAGNOSIS — Z0000 Encounter for general adult medical examination without abnormal findings: Secondary | ICD-10-CM | POA: Diagnosis not present

## 2023-03-18 DIAGNOSIS — R079 Chest pain, unspecified: Secondary | ICD-10-CM | POA: Diagnosis not present

## 2023-03-18 DIAGNOSIS — R0789 Other chest pain: Secondary | ICD-10-CM | POA: Diagnosis not present

## 2023-03-20 ENCOUNTER — Other Ambulatory Visit: Payer: Self-pay | Admitting: Family

## 2023-03-20 DIAGNOSIS — Z1231 Encounter for screening mammogram for malignant neoplasm of breast: Secondary | ICD-10-CM

## 2023-03-21 DIAGNOSIS — E063 Autoimmune thyroiditis: Secondary | ICD-10-CM | POA: Diagnosis not present

## 2023-03-21 DIAGNOSIS — Z6826 Body mass index (BMI) 26.0-26.9, adult: Secondary | ICD-10-CM | POA: Diagnosis not present

## 2023-03-22 DIAGNOSIS — Z8679 Personal history of other diseases of the circulatory system: Secondary | ICD-10-CM | POA: Diagnosis not present

## 2023-03-22 DIAGNOSIS — R079 Chest pain, unspecified: Secondary | ICD-10-CM | POA: Diagnosis not present

## 2023-03-22 DIAGNOSIS — R0789 Other chest pain: Secondary | ICD-10-CM | POA: Diagnosis not present

## 2023-04-02 DIAGNOSIS — R0789 Other chest pain: Secondary | ICD-10-CM | POA: Diagnosis not present

## 2023-04-02 DIAGNOSIS — Z8679 Personal history of other diseases of the circulatory system: Secondary | ICD-10-CM | POA: Diagnosis not present

## 2023-04-02 DIAGNOSIS — I209 Angina pectoris, unspecified: Secondary | ICD-10-CM | POA: Diagnosis not present

## 2023-04-02 DIAGNOSIS — R0602 Shortness of breath: Secondary | ICD-10-CM | POA: Diagnosis not present

## 2023-04-02 DIAGNOSIS — E785 Hyperlipidemia, unspecified: Secondary | ICD-10-CM | POA: Diagnosis not present

## 2023-04-04 DIAGNOSIS — E782 Mixed hyperlipidemia: Secondary | ICD-10-CM | POA: Diagnosis not present

## 2023-04-04 DIAGNOSIS — Z6826 Body mass index (BMI) 26.0-26.9, adult: Secondary | ICD-10-CM | POA: Diagnosis not present

## 2023-04-08 ENCOUNTER — Ambulatory Visit
Admission: RE | Admit: 2023-04-08 | Discharge: 2023-04-08 | Disposition: A | Discharge status: Home / Self Care | Payer: Medicare HMO | Source: Ambulatory Visit | Attending: Family | Admitting: Family

## 2023-04-08 DIAGNOSIS — Z1231 Encounter for screening mammogram for malignant neoplasm of breast: Secondary | ICD-10-CM

## 2023-04-10 DIAGNOSIS — H2513 Age-related nuclear cataract, bilateral: Secondary | ICD-10-CM | POA: Diagnosis not present

## 2023-04-10 DIAGNOSIS — R5382 Chronic fatigue, unspecified: Secondary | ICD-10-CM | POA: Diagnosis not present

## 2023-04-10 DIAGNOSIS — Z79890 Hormone replacement therapy (postmenopausal): Secondary | ICD-10-CM | POA: Diagnosis not present

## 2023-04-10 DIAGNOSIS — E7800 Pure hypercholesterolemia, unspecified: Secondary | ICD-10-CM | POA: Diagnosis not present

## 2023-04-10 DIAGNOSIS — H52203 Unspecified astigmatism, bilateral: Secondary | ICD-10-CM | POA: Diagnosis not present

## 2023-04-10 DIAGNOSIS — Z6826 Body mass index (BMI) 26.0-26.9, adult: Secondary | ICD-10-CM | POA: Diagnosis not present

## 2023-04-10 DIAGNOSIS — H04123 Dry eye syndrome of bilateral lacrimal glands: Secondary | ICD-10-CM | POA: Diagnosis not present

## 2023-04-10 DIAGNOSIS — H353131 Nonexudative age-related macular degeneration, bilateral, early dry stage: Secondary | ICD-10-CM | POA: Diagnosis not present

## 2023-04-10 DIAGNOSIS — H5213 Myopia, bilateral: Secondary | ICD-10-CM | POA: Diagnosis not present

## 2023-04-11 IMAGING — MG MM DIGITAL DIAGNOSTIC UNILAT*R* W/ TOMO W/ CAD
6 of 10 series · 6 of 30 positions shown · non-contrast
Comparison: Previous exam(s).

CLINICAL DATA: 66-year-old female presenting as a recall from
screening for possible right breast mass.

EXAM:
DIGITAL DIAGNOSTIC UNILATERAL RIGHT MAMMOGRAM WITH TOMOSYNTHESIS AND
CAD
TECHNIQUE: Right digital diagnostic mammography and breast tomosynthesis was
performed. The images were evaluated with computer-aided detection.

[R ML synth-2D]
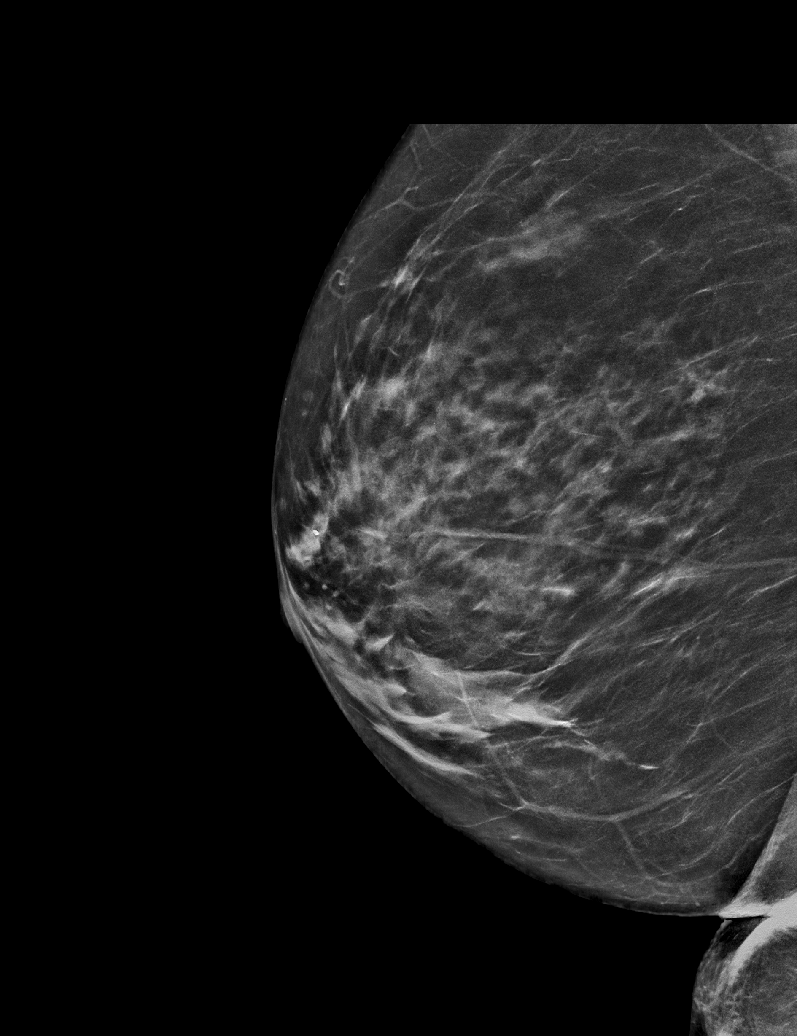

[R CC synth-2D (1 of 3)]
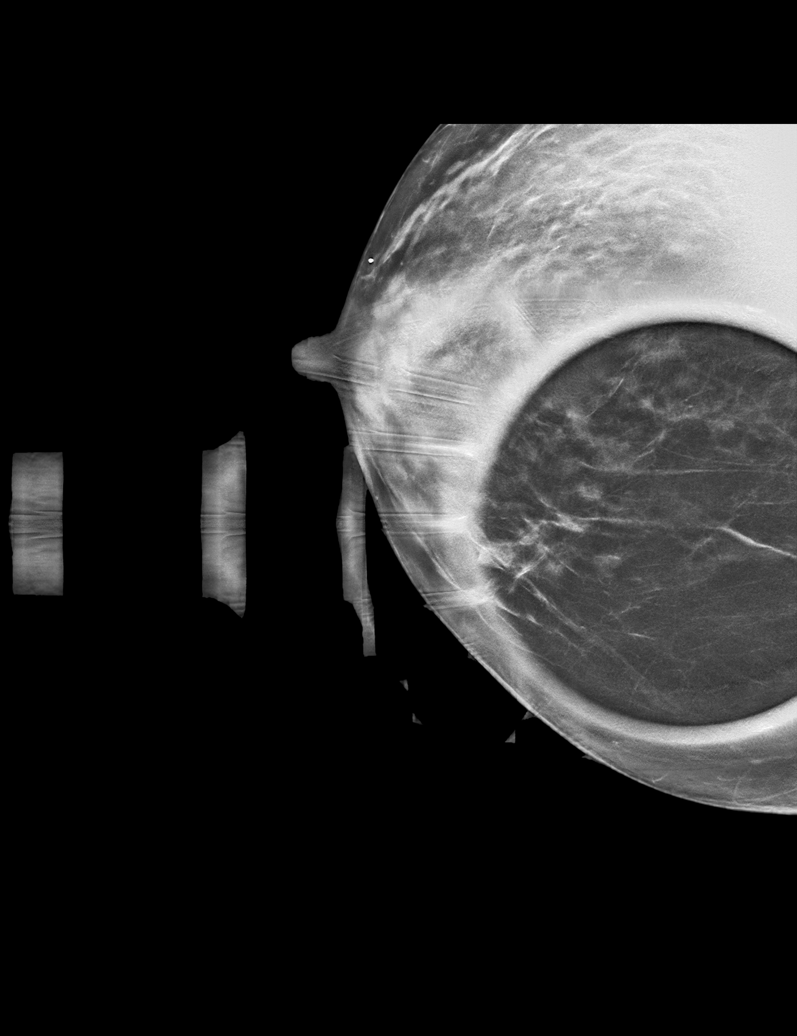

[R CC synth-2D (2 of 3)]
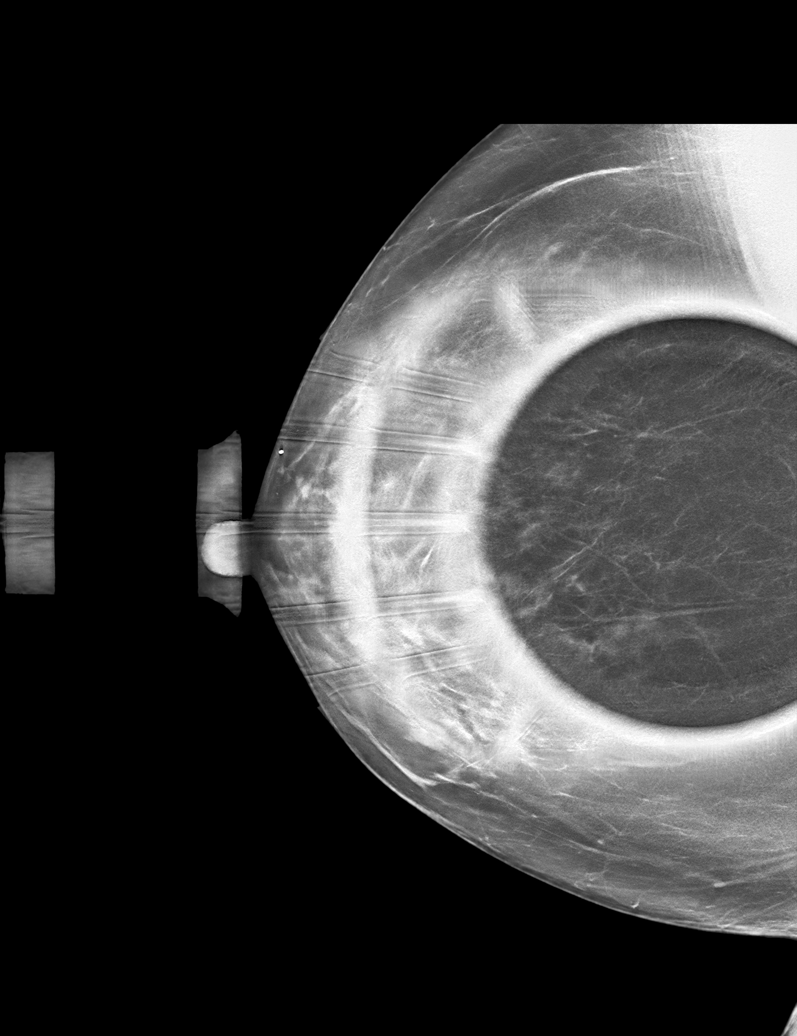

[R CC synth-2D (3 of 3)]
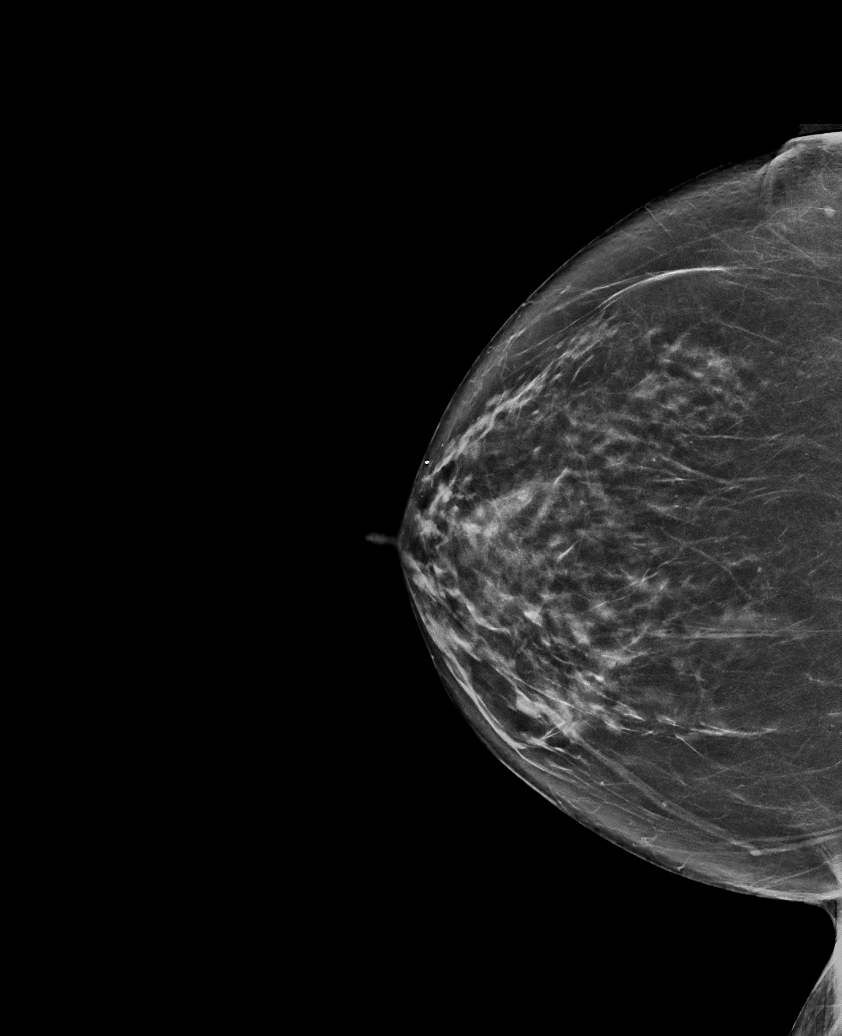

[R MLO synth-2D]
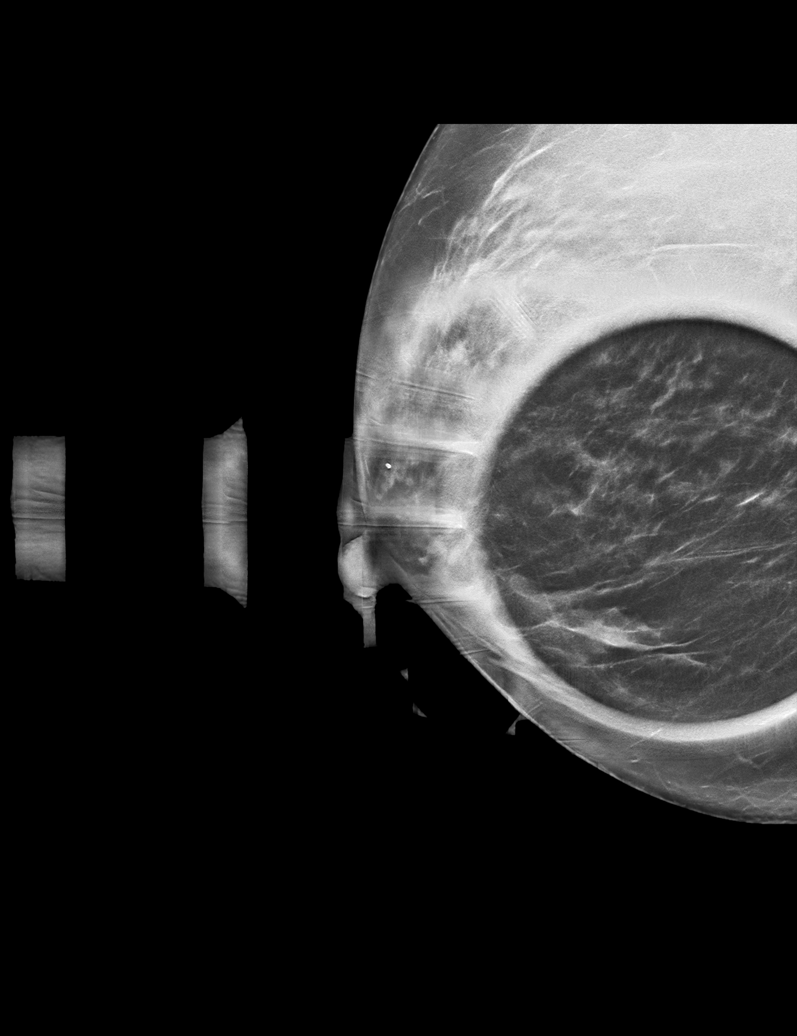

[R CC tomo · tomo slice 33/65.0]
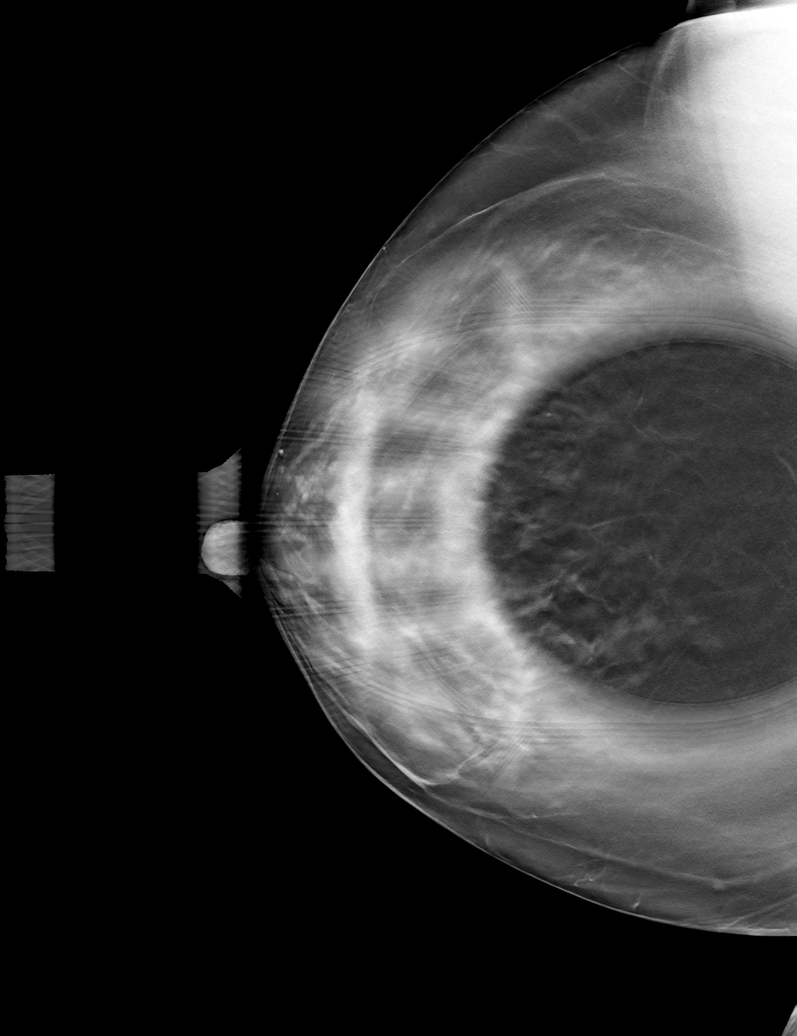

[6 of 30 positions shown; findings below may reference images not displayed]

ACR Breast Density Category b: There are scattered areas of
fibroglandular density.
FINDINGS: Spot compression tomosynthesis as well as full CC and true lateral
tomosynthesis views of the right breast were performed for a
questioned mass seen best on cc view in the upper inner breast. On
the additional imaging the asymmetry effaces and is most consistent
with normal fibroglandular tissue. The mammographic appearance of
this area stable dating back to 4848. There is no new suspicious
finding.
IMPRESSION: No mammographic evidence of malignancy in the right breast.

RECOMMENDATION:
Screening mammogram in one year.(Code:48-5-78T)

I have discussed the findings and recommendations with the patient.
If applicable, a reminder letter will be sent to the patient
regarding the next appointment.

BI-RADS CATEGORY  1: Negative.

## 2023-04-16 ENCOUNTER — Ambulatory Visit: Payer: Medicare HMO

## 2023-04-16 DIAGNOSIS — M65341 Trigger finger, right ring finger: Secondary | ICD-10-CM | POA: Diagnosis not present

## 2023-04-16 DIAGNOSIS — M65331 Trigger finger, right middle finger: Secondary | ICD-10-CM | POA: Diagnosis not present

## 2023-04-24 DIAGNOSIS — R0602 Shortness of breath: Secondary | ICD-10-CM | POA: Diagnosis not present

## 2023-04-24 DIAGNOSIS — R0789 Other chest pain: Secondary | ICD-10-CM | POA: Diagnosis not present

## 2023-04-24 DIAGNOSIS — Z8679 Personal history of other diseases of the circulatory system: Secondary | ICD-10-CM | POA: Diagnosis not present

## 2023-04-24 DIAGNOSIS — E785 Hyperlipidemia, unspecified: Secondary | ICD-10-CM | POA: Diagnosis not present

## 2023-06-06 DIAGNOSIS — M810 Age-related osteoporosis without current pathological fracture: Secondary | ICD-10-CM | POA: Diagnosis not present

## 2023-07-24 DIAGNOSIS — Z6825 Body mass index (BMI) 25.0-25.9, adult: Secondary | ICD-10-CM | POA: Diagnosis not present

## 2023-07-24 DIAGNOSIS — N951 Menopausal and female climacteric states: Secondary | ICD-10-CM | POA: Diagnosis not present

## 2023-07-24 DIAGNOSIS — G479 Sleep disorder, unspecified: Secondary | ICD-10-CM | POA: Diagnosis not present

## 2023-07-24 DIAGNOSIS — E063 Autoimmune thyroiditis: Secondary | ICD-10-CM | POA: Diagnosis not present

## 2023-07-24 DIAGNOSIS — F419 Anxiety disorder, unspecified: Secondary | ICD-10-CM | POA: Diagnosis not present

## 2023-07-24 DIAGNOSIS — Z79890 Hormone replacement therapy (postmenopausal): Secondary | ICD-10-CM | POA: Diagnosis not present

## 2023-09-11 DIAGNOSIS — M65331 Trigger finger, right middle finger: Secondary | ICD-10-CM | POA: Diagnosis not present

## 2023-09-11 DIAGNOSIS — M65341 Trigger finger, right ring finger: Secondary | ICD-10-CM | POA: Diagnosis not present

## 2023-11-14 DIAGNOSIS — N951 Menopausal and female climacteric states: Secondary | ICD-10-CM | POA: Diagnosis not present

## 2023-11-14 DIAGNOSIS — R6882 Decreased libido: Secondary | ICD-10-CM | POA: Diagnosis not present

## 2023-11-14 DIAGNOSIS — E063 Autoimmune thyroiditis: Secondary | ICD-10-CM | POA: Diagnosis not present

## 2023-11-14 DIAGNOSIS — Z79890 Hormone replacement therapy (postmenopausal): Secondary | ICD-10-CM | POA: Diagnosis not present

## 2023-11-14 DIAGNOSIS — N898 Other specified noninflammatory disorders of vagina: Secondary | ICD-10-CM | POA: Diagnosis not present

## 2023-11-14 DIAGNOSIS — Z6825 Body mass index (BMI) 25.0-25.9, adult: Secondary | ICD-10-CM | POA: Diagnosis not present

## 2024-02-19 DIAGNOSIS — L720 Epidermal cyst: Secondary | ICD-10-CM | POA: Diagnosis not present

## 2024-02-19 DIAGNOSIS — L814 Other melanin hyperpigmentation: Secondary | ICD-10-CM | POA: Diagnosis not present

## 2024-02-19 DIAGNOSIS — L820 Inflamed seborrheic keratosis: Secondary | ICD-10-CM | POA: Diagnosis not present

## 2024-02-19 DIAGNOSIS — L821 Other seborrheic keratosis: Secondary | ICD-10-CM | POA: Diagnosis not present

## 2024-02-19 DIAGNOSIS — D1801 Hemangioma of skin and subcutaneous tissue: Secondary | ICD-10-CM | POA: Diagnosis not present

## 2024-02-27 DIAGNOSIS — Z6825 Body mass index (BMI) 25.0-25.9, adult: Secondary | ICD-10-CM | POA: Diagnosis not present

## 2024-02-27 DIAGNOSIS — R6882 Decreased libido: Secondary | ICD-10-CM | POA: Diagnosis not present

## 2024-02-27 DIAGNOSIS — N951 Menopausal and female climacteric states: Secondary | ICD-10-CM | POA: Diagnosis not present

## 2024-02-27 DIAGNOSIS — N898 Other specified noninflammatory disorders of vagina: Secondary | ICD-10-CM | POA: Diagnosis not present

## 2024-02-27 DIAGNOSIS — E063 Autoimmune thyroiditis: Secondary | ICD-10-CM | POA: Diagnosis not present

## 2024-02-27 DIAGNOSIS — Z79890 Hormone replacement therapy (postmenopausal): Secondary | ICD-10-CM | POA: Diagnosis not present

## 2024-03-11 DIAGNOSIS — R5383 Other fatigue: Secondary | ICD-10-CM | POA: Diagnosis not present

## 2024-03-11 DIAGNOSIS — M810 Age-related osteoporosis without current pathological fracture: Secondary | ICD-10-CM | POA: Diagnosis not present

## 2024-03-11 DIAGNOSIS — R519 Headache, unspecified: Secondary | ICD-10-CM | POA: Diagnosis not present

## 2024-03-11 DIAGNOSIS — Z1159 Encounter for screening for other viral diseases: Secondary | ICD-10-CM | POA: Diagnosis not present

## 2024-03-11 DIAGNOSIS — Z0000 Encounter for general adult medical examination without abnormal findings: Secondary | ICD-10-CM | POA: Diagnosis not present

## 2024-03-11 DIAGNOSIS — E785 Hyperlipidemia, unspecified: Secondary | ICD-10-CM | POA: Diagnosis not present

## 2024-03-11 DIAGNOSIS — E039 Hypothyroidism, unspecified: Secondary | ICD-10-CM | POA: Diagnosis not present

## 2024-03-11 DIAGNOSIS — E559 Vitamin D deficiency, unspecified: Secondary | ICD-10-CM | POA: Diagnosis not present

## 2024-03-18 ENCOUNTER — Other Ambulatory Visit: Payer: Self-pay | Admitting: Family

## 2024-03-18 DIAGNOSIS — Z1231 Encounter for screening mammogram for malignant neoplasm of breast: Secondary | ICD-10-CM

## 2024-04-03 DIAGNOSIS — N3000 Acute cystitis without hematuria: Secondary | ICD-10-CM | POA: Diagnosis not present

## 2024-04-09 DIAGNOSIS — M461 Sacroiliitis, not elsewhere classified: Secondary | ICD-10-CM | POA: Diagnosis not present

## 2024-04-15 ENCOUNTER — Ambulatory Visit
Admission: RE | Admit: 2024-04-15 | Discharge: 2024-04-15 | Disposition: A | Discharge status: Home / Self Care | Source: Ambulatory Visit | Attending: Family | Admitting: Family

## 2024-04-15 DIAGNOSIS — H5213 Myopia, bilateral: Secondary | ICD-10-CM | POA: Diagnosis not present

## 2024-04-15 DIAGNOSIS — Z1231 Encounter for screening mammogram for malignant neoplasm of breast: Secondary | ICD-10-CM

## 2024-04-15 DIAGNOSIS — H2513 Age-related nuclear cataract, bilateral: Secondary | ICD-10-CM | POA: Diagnosis not present

## 2024-04-15 DIAGNOSIS — H04123 Dry eye syndrome of bilateral lacrimal glands: Secondary | ICD-10-CM | POA: Diagnosis not present

## 2024-04-15 DIAGNOSIS — H353131 Nonexudative age-related macular degeneration, bilateral, early dry stage: Secondary | ICD-10-CM | POA: Diagnosis not present

## 2024-04-15 DIAGNOSIS — H524 Presbyopia: Secondary | ICD-10-CM | POA: Diagnosis not present

## 2024-04-29 DIAGNOSIS — M810 Age-related osteoporosis without current pathological fracture: Secondary | ICD-10-CM | POA: Diagnosis not present

## 2024-04-29 DIAGNOSIS — M5134 Other intervertebral disc degeneration, thoracic region: Secondary | ICD-10-CM | POA: Diagnosis not present

## 2024-04-29 DIAGNOSIS — M8580 Other specified disorders of bone density and structure, unspecified site: Secondary | ICD-10-CM | POA: Diagnosis not present

## 2024-04-29 DIAGNOSIS — M47816 Spondylosis without myelopathy or radiculopathy, lumbar region: Secondary | ICD-10-CM | POA: Diagnosis not present

## 2024-05-26 DIAGNOSIS — M461 Sacroiliitis, not elsewhere classified: Secondary | ICD-10-CM | POA: Diagnosis not present

## 2024-05-26 DIAGNOSIS — M5416 Radiculopathy, lumbar region: Secondary | ICD-10-CM | POA: Diagnosis not present

## 2024-05-28 DIAGNOSIS — M461 Sacroiliitis, not elsewhere classified: Secondary | ICD-10-CM | POA: Diagnosis not present

## 2024-05-28 DIAGNOSIS — M898X8 Other specified disorders of bone, other site: Secondary | ICD-10-CM | POA: Diagnosis not present

## 2024-05-28 DIAGNOSIS — M545 Low back pain, unspecified: Secondary | ICD-10-CM | POA: Diagnosis not present

## 2024-05-28 DIAGNOSIS — M25551 Pain in right hip: Secondary | ICD-10-CM | POA: Diagnosis not present

## 2024-05-29 DIAGNOSIS — M47816 Spondylosis without myelopathy or radiculopathy, lumbar region: Secondary | ICD-10-CM | POA: Diagnosis not present

## 2024-05-29 DIAGNOSIS — M5416 Radiculopathy, lumbar region: Secondary | ICD-10-CM | POA: Diagnosis not present

## 2024-05-29 DIAGNOSIS — M5136 Other intervertebral disc degeneration, lumbar region with discogenic back pain only: Secondary | ICD-10-CM | POA: Diagnosis not present

## 2024-05-29 DIAGNOSIS — M419 Scoliosis, unspecified: Secondary | ICD-10-CM | POA: Diagnosis not present

## 2024-05-29 DIAGNOSIS — M48061 Spinal stenosis, lumbar region without neurogenic claudication: Secondary | ICD-10-CM | POA: Diagnosis not present

## 2024-06-02 DIAGNOSIS — M461 Sacroiliitis, not elsewhere classified: Secondary | ICD-10-CM | POA: Diagnosis not present

## 2024-06-02 DIAGNOSIS — M5416 Radiculopathy, lumbar region: Secondary | ICD-10-CM | POA: Diagnosis not present

## 2024-06-07 DIAGNOSIS — M6281 Muscle weakness (generalized): Secondary | ICD-10-CM | POA: Diagnosis not present

## 2024-06-07 DIAGNOSIS — M461 Sacroiliitis, not elsewhere classified: Secondary | ICD-10-CM | POA: Diagnosis not present

## 2024-06-07 DIAGNOSIS — M545 Low back pain, unspecified: Secondary | ICD-10-CM | POA: Diagnosis not present

## 2024-06-10 DIAGNOSIS — Z6826 Body mass index (BMI) 26.0-26.9, adult: Secondary | ICD-10-CM | POA: Diagnosis not present

## 2024-06-10 DIAGNOSIS — E063 Autoimmune thyroiditis: Secondary | ICD-10-CM | POA: Diagnosis not present

## 2024-06-10 DIAGNOSIS — M2550 Pain in unspecified joint: Secondary | ICD-10-CM | POA: Diagnosis not present

## 2024-06-10 DIAGNOSIS — M6281 Muscle weakness (generalized): Secondary | ICD-10-CM | POA: Diagnosis not present

## 2024-06-10 DIAGNOSIS — M545 Low back pain, unspecified: Secondary | ICD-10-CM | POA: Diagnosis not present

## 2024-06-10 DIAGNOSIS — Z79890 Hormone replacement therapy (postmenopausal): Secondary | ICD-10-CM | POA: Diagnosis not present

## 2024-06-10 DIAGNOSIS — N951 Menopausal and female climacteric states: Secondary | ICD-10-CM | POA: Diagnosis not present

## 2024-06-10 DIAGNOSIS — N898 Other specified noninflammatory disorders of vagina: Secondary | ICD-10-CM | POA: Diagnosis not present

## 2024-06-11 DIAGNOSIS — M810 Age-related osteoporosis without current pathological fracture: Secondary | ICD-10-CM | POA: Diagnosis not present

## 2024-06-14 DIAGNOSIS — M545 Low back pain, unspecified: Secondary | ICD-10-CM | POA: Diagnosis not present

## 2024-06-14 DIAGNOSIS — M6281 Muscle weakness (generalized): Secondary | ICD-10-CM | POA: Diagnosis not present

## 2024-06-17 DIAGNOSIS — M545 Low back pain, unspecified: Secondary | ICD-10-CM | POA: Diagnosis not present

## 2024-06-17 DIAGNOSIS — M6281 Muscle weakness (generalized): Secondary | ICD-10-CM | POA: Diagnosis not present

## 2024-06-18 DIAGNOSIS — R350 Frequency of micturition: Secondary | ICD-10-CM | POA: Diagnosis not present

## 2024-06-18 DIAGNOSIS — R140 Abdominal distension (gaseous): Secondary | ICD-10-CM | POA: Diagnosis not present

## 2024-06-18 DIAGNOSIS — M461 Sacroiliitis, not elsewhere classified: Secondary | ICD-10-CM | POA: Diagnosis not present

## 2024-06-21 DIAGNOSIS — M545 Low back pain, unspecified: Secondary | ICD-10-CM | POA: Diagnosis not present

## 2024-06-21 DIAGNOSIS — M5416 Radiculopathy, lumbar region: Secondary | ICD-10-CM | POA: Diagnosis not present

## 2024-06-21 DIAGNOSIS — M461 Sacroiliitis, not elsewhere classified: Secondary | ICD-10-CM | POA: Diagnosis not present

## 2024-06-21 DIAGNOSIS — M6281 Muscle weakness (generalized): Secondary | ICD-10-CM | POA: Diagnosis not present

## 2024-06-24 DIAGNOSIS — M545 Low back pain, unspecified: Secondary | ICD-10-CM | POA: Diagnosis not present

## 2024-06-24 DIAGNOSIS — M6281 Muscle weakness (generalized): Secondary | ICD-10-CM | POA: Diagnosis not present

## 2024-06-29 DIAGNOSIS — M545 Low back pain, unspecified: Secondary | ICD-10-CM | POA: Diagnosis not present

## 2024-06-29 DIAGNOSIS — M6281 Muscle weakness (generalized): Secondary | ICD-10-CM | POA: Diagnosis not present

## 2024-07-01 DIAGNOSIS — M545 Low back pain, unspecified: Secondary | ICD-10-CM | POA: Diagnosis not present

## 2024-07-01 DIAGNOSIS — M6281 Muscle weakness (generalized): Secondary | ICD-10-CM | POA: Diagnosis not present

## 2024-07-06 DIAGNOSIS — M545 Low back pain, unspecified: Secondary | ICD-10-CM | POA: Diagnosis not present

## 2024-07-06 DIAGNOSIS — M6281 Muscle weakness (generalized): Secondary | ICD-10-CM | POA: Diagnosis not present

## 2024-07-08 DIAGNOSIS — M545 Low back pain, unspecified: Secondary | ICD-10-CM | POA: Diagnosis not present

## 2024-07-08 DIAGNOSIS — M6281 Muscle weakness (generalized): Secondary | ICD-10-CM | POA: Diagnosis not present

## 2024-07-12 DIAGNOSIS — M545 Low back pain, unspecified: Secondary | ICD-10-CM | POA: Diagnosis not present

## 2024-07-12 DIAGNOSIS — M6281 Muscle weakness (generalized): Secondary | ICD-10-CM | POA: Diagnosis not present

## 2024-07-15 DIAGNOSIS — M545 Low back pain, unspecified: Secondary | ICD-10-CM | POA: Diagnosis not present

## 2024-07-15 DIAGNOSIS — M6281 Muscle weakness (generalized): Secondary | ICD-10-CM | POA: Diagnosis not present

## 2024-07-26 DIAGNOSIS — M6281 Muscle weakness (generalized): Secondary | ICD-10-CM | POA: Diagnosis not present

## 2024-07-26 DIAGNOSIS — M545 Low back pain, unspecified: Secondary | ICD-10-CM | POA: Diagnosis not present

## 2024-07-30 DIAGNOSIS — M6281 Muscle weakness (generalized): Secondary | ICD-10-CM | POA: Diagnosis not present

## 2024-07-30 DIAGNOSIS — M545 Low back pain, unspecified: Secondary | ICD-10-CM | POA: Diagnosis not present

## 2024-08-03 DIAGNOSIS — M6281 Muscle weakness (generalized): Secondary | ICD-10-CM | POA: Diagnosis not present

## 2024-08-03 DIAGNOSIS — M545 Low back pain, unspecified: Secondary | ICD-10-CM | POA: Diagnosis not present

## 2024-08-06 DIAGNOSIS — M545 Low back pain, unspecified: Secondary | ICD-10-CM | POA: Diagnosis not present

## 2024-08-06 DIAGNOSIS — M6281 Muscle weakness (generalized): Secondary | ICD-10-CM | POA: Diagnosis not present

## 2024-08-10 DIAGNOSIS — M6281 Muscle weakness (generalized): Secondary | ICD-10-CM | POA: Diagnosis not present

## 2024-08-10 DIAGNOSIS — M545 Low back pain, unspecified: Secondary | ICD-10-CM | POA: Diagnosis not present

## 2024-08-12 DIAGNOSIS — M545 Low back pain, unspecified: Secondary | ICD-10-CM | POA: Diagnosis not present

## 2024-08-12 DIAGNOSIS — M6281 Muscle weakness (generalized): Secondary | ICD-10-CM | POA: Diagnosis not present

## 2024-08-16 DIAGNOSIS — M6281 Muscle weakness (generalized): Secondary | ICD-10-CM | POA: Diagnosis not present

## 2024-08-16 DIAGNOSIS — M545 Low back pain, unspecified: Secondary | ICD-10-CM | POA: Diagnosis not present

## 2024-08-23 DIAGNOSIS — M545 Low back pain, unspecified: Secondary | ICD-10-CM | POA: Diagnosis not present

## 2024-08-23 DIAGNOSIS — M6281 Muscle weakness (generalized): Secondary | ICD-10-CM | POA: Diagnosis not present

## 2024-08-26 DIAGNOSIS — M6281 Muscle weakness (generalized): Secondary | ICD-10-CM | POA: Diagnosis not present

## 2024-08-26 DIAGNOSIS — M545 Low back pain, unspecified: Secondary | ICD-10-CM | POA: Diagnosis not present

## 2024-08-31 DIAGNOSIS — M545 Low back pain, unspecified: Secondary | ICD-10-CM | POA: Diagnosis not present

## 2024-08-31 DIAGNOSIS — M6281 Muscle weakness (generalized): Secondary | ICD-10-CM | POA: Diagnosis not present

## 2024-09-02 DIAGNOSIS — M545 Low back pain, unspecified: Secondary | ICD-10-CM | POA: Diagnosis not present

## 2024-09-02 DIAGNOSIS — M6281 Muscle weakness (generalized): Secondary | ICD-10-CM | POA: Diagnosis not present

## 2024-09-07 DIAGNOSIS — M545 Low back pain, unspecified: Secondary | ICD-10-CM | POA: Diagnosis not present

## 2024-09-07 DIAGNOSIS — M6281 Muscle weakness (generalized): Secondary | ICD-10-CM | POA: Diagnosis not present

## 2024-09-09 DIAGNOSIS — M6281 Muscle weakness (generalized): Secondary | ICD-10-CM | POA: Diagnosis not present

## 2024-09-09 DIAGNOSIS — M545 Low back pain, unspecified: Secondary | ICD-10-CM | POA: Diagnosis not present

## 2024-09-14 DIAGNOSIS — M6281 Muscle weakness (generalized): Secondary | ICD-10-CM | POA: Diagnosis not present

## 2024-09-14 DIAGNOSIS — M545 Low back pain, unspecified: Secondary | ICD-10-CM | POA: Diagnosis not present

## 2024-09-19 DIAGNOSIS — R070 Pain in throat: Secondary | ICD-10-CM | POA: Diagnosis not present

## 2024-09-19 DIAGNOSIS — R0981 Nasal congestion: Secondary | ICD-10-CM | POA: Diagnosis not present
